# Patient Record
Sex: Male | Born: 1980 | Race: Black or African American | Hispanic: No | Marital: Single | Smoking: Current every day smoker
Health system: Southern US, Community
[De-identification: ages and names within clinical notes are randomized; demographics above are authoritative.]

## PROBLEM LIST (undated history)

## (undated) DIAGNOSIS — E119 Type 2 diabetes mellitus without complications: Secondary | ICD-10-CM

## (undated) HISTORY — PX: PENILE CYST REMOVAL: SHX2199

---

## 2005-04-03 ENCOUNTER — Emergency Department: Payer: Self-pay | Admitting: Emergency Medicine

## 2005-04-24 ENCOUNTER — Ambulatory Visit: Payer: Self-pay | Admitting: Physician Assistant

## 2007-05-28 ENCOUNTER — Ambulatory Visit: Payer: Self-pay | Admitting: Internal Medicine

## 2011-05-11 ENCOUNTER — Emergency Department: Payer: Self-pay | Admitting: Unknown Physician Specialty

## 2012-07-18 ENCOUNTER — Emergency Department: Payer: Self-pay | Admitting: Emergency Medicine

## 2012-07-19 LAB — COMPREHENSIVE METABOLIC PANEL
Albumin: 3.2 g/dL — ABNORMAL LOW (ref 3.4–5.0)
Anion Gap: 10 (ref 7–16)
BUN: 11 mg/dL (ref 7–18)
Bilirubin,Total: 0.2 mg/dL (ref 0.2–1.0)
Calcium, Total: 8.3 mg/dL — ABNORMAL LOW (ref 8.5–10.1)
Chloride: 109 mmol/L — ABNORMAL HIGH (ref 98–107)
EGFR (Non-African Amer.): >60
Glucose: 108 mg/dL — ABNORMAL HIGH (ref 65–99)
Potassium: 4.4 mmol/L (ref 3.5–5.1)
SGPT (ALT): 121 U/L — ABNORMAL HIGH (ref 12–78)
Total Protein: 7.4 g/dL (ref 6.4–8.2)

## 2012-07-19 LAB — URINALYSIS, COMPLETE
Bacteria: NONE SEEN
Bacteria: NONE SEEN
Bilirubin,UR: NEGATIVE
Bilirubin,UR: NEGATIVE
Glucose,UR: NEGATIVE mg/dL (ref 0–75)
Leukocyte Esterase: NEGATIVE
Ph: 6 (ref 4.5–8.0)
Protein: 100
Protein: 100
RBC,UR: <1 /HPF (ref 0–5)
Specific Gravity: 1.025 (ref 1.003–1.030)
Specific Gravity: 1.029 (ref 1.003–1.030)
Squamous Epithelial: NONE SEEN
WBC UR: 1 /HPF (ref 0–5)

## 2012-07-19 LAB — CBC
HCT: 48 % (ref 40.0–52.0)
HGB: 16.1 g/dL (ref 13.0–18.0)
MCH: 30.7 pg (ref 26.0–34.0)
MCHC: 33.6 g/dL (ref 32.0–36.0)
MCV: 91 fL (ref 80–100)
RDW: 14.2 % (ref 11.5–14.5)
WBC: 15.4 10*3/uL — ABNORMAL HIGH (ref 3.8–10.6)

## 2012-07-20 ENCOUNTER — Inpatient Hospital Stay: Payer: Self-pay | Admitting: Internal Medicine

## 2012-07-20 LAB — BASIC METABOLIC PANEL
Anion Gap: 8 (ref 7–16)
BUN: 9 mg/dL (ref 7–18)
Creatinine: 0.91 mg/dL (ref 0.60–1.30)
EGFR (Non-African Amer.): >60
Osmolality: 279 (ref 275–301)
Potassium: 3.9 mmol/L (ref 3.5–5.1)
Sodium: 140 mmol/L (ref 136–145)

## 2012-07-20 LAB — URINALYSIS, COMPLETE
Bacteria: NONE SEEN
Glucose,UR: NEGATIVE mg/dL (ref 0–75)
Ketone: NEGATIVE
Nitrite: NEGATIVE
Ph: 7 (ref 4.5–8.0)
Protein: NEGATIVE
RBC,UR: NONE SEEN /HPF (ref 0–5)
Specific Gravity: 1.005 (ref 1.003–1.030)
WBC UR: <1 /HPF (ref 0–5)

## 2012-07-20 LAB — CK
CK, Total: 48190 U/L — ABNORMAL HIGH (ref 35–232)
CK, Total: 44659 U/L — ABNORMAL HIGH (ref 35–232)

## 2012-07-21 LAB — COMPREHENSIVE METABOLIC PANEL
Alkaline Phosphatase: 65 U/L (ref 50–136)
BUN: 8 mg/dL (ref 7–18)
Bilirubin,Total: 0.3 mg/dL (ref 0.2–1.0)
Chloride: 109 mmol/L — ABNORMAL HIGH (ref 98–107)
Osmolality: 280 (ref 275–301)
Potassium: 4.2 mmol/L (ref 3.5–5.1)
Total Protein: 6.4 g/dL (ref 6.4–8.2)

## 2012-07-21 LAB — CK: CK, Total: 46768 U/L — ABNORMAL HIGH (ref 35–232)

## 2013-06-03 LAB — CBC
HGB: 14.5 g/dL (ref 13.0–18.0)
MCH: 30.1 pg (ref 26.0–34.0)
Platelet: 304 10*3/uL (ref 150–440)
RBC: 4.8 10*6/uL (ref 4.40–5.90)
RDW: 14 % (ref 11.5–14.5)

## 2013-06-04 ENCOUNTER — Inpatient Hospital Stay: Payer: Self-pay | Admitting: Internal Medicine

## 2013-06-04 LAB — COMPREHENSIVE METABOLIC PANEL
Alkaline Phosphatase: 89 U/L (ref 50–136)
Anion Gap: 3 — ABNORMAL LOW (ref 7–16)
BUN: 11 mg/dL (ref 7–18)
Co2: 28 mmol/L (ref 21–32)
Creatinine: 1.09 mg/dL (ref 0.60–1.30)
Glucose: 137 mg/dL — ABNORMAL HIGH (ref 65–99)
Osmolality: 279 (ref 275–301)
Potassium: 3.6 mmol/L (ref 3.5–5.1)
SGOT(AST): 17 U/L (ref 15–37)
SGPT (ALT): 22 U/L (ref 12–78)
Sodium: 139 mmol/L (ref 136–145)
Total Protein: 7.1 g/dL (ref 6.4–8.2)

## 2013-06-04 LAB — WBC: WBC: 20.5 10*3/uL — ABNORMAL HIGH (ref 3.8–10.6)

## 2013-06-09 LAB — CULTURE, BLOOD (SINGLE)

## 2014-11-02 ENCOUNTER — Emergency Department: Admit: 2014-11-02 | Discharge status: Against Medical Advice | Payer: Self-pay | Admitting: Emergency Medicine

## 2014-11-04 NOTE — Discharge Summary (Signed)
PATIENT NAME:  Randy Harris, Randy Harris MR#:  [id number] DATE OF BIRTH:  [birth year]  DATE OF ADMISSION:  07/20/2012 DATE OF DISCHARGE:  07/21/2012  ADMITTING PHYSICIAN: Marlaine HindAmir Darwish, MD    DISCHARGING PHYSICIAN: Enid Baasadhika Jilliam Bellmore, MD  PRIMARY CARE PHYSICIAN: None   NOTE: The patient left AGAINST MEDICAL ADVICE.    CONSULTATIONS IN THE HOSPITAL: None.  DISCHARGE DIAGNOSES: 1. Rhabdomyolysis with CPK at 46,000 when the patient left AGAINST MEDICAL ADVICE.   2. Myoglobinuria.  3. Elevated liver function tests secondary to muscle injury.   LABORATORY, DIAGNOSTIC AND RADIOLOGICAL DATA PRIOR TO DISCHARGE:  Total CK is 46,768.  Sodium 141, potassium 4.2, chloride 109, bicarbonate 25, BUN 8, creatinine 0.7, glucose 103, calcium of 8.2.  ALT 148, AST, 408, alkaline phosphatase 65, total bilirubin 0.3, albumin of 3.0. Urinalysis with 3+ blood and no RBCs seen.  Hepatitis panel is negative. WBC 15.4 on admission, hemoglobin 16.1, hematocrit 48.0, platelet count of 407.  Ultrasound testicle done for pain showed normal testicular ultrasound.  CT of the abdomen and pelvis was negative for any acute abnormalities.  BRIEF HOSPITAL COURSE: The patient is an obese 34 year old African American male who started recently vigorous exercise and comes with muscles aches and also bloody urine.   Rhabdomyolysis: The patient was found to be in acute rhabdomyolysis with CPK levels greater than 40,000 on admission. Probably the recent vigorous exercise could have triggered it. He was started on IV fluids and was encouraged to drink lot of water by mouth as well. His urine was clearing up, though his UA still showed 3+ blood with no RBCs in it. His CPK is about the same after one day of IV hydration, though clinically he feels a lot better. The patient was advised to stay for 1 more day to see if CPK improves. His renal function has not been affected so far. His LFTs are still elevated. However, due to some personal issues at  home the patient had to leave, and so he left AGAINST MEDICAL ADVICE.  All the risks were explained to the patient. Also, he was given a lab slip for follow-up CPK and basic metabolic panel in the next 4 days. He said he would go to an urgent care or ER to get that done. He was also advised to come back to the ER if any symptoms return. His course has been otherwise uneventful in the hospital.   ADDITIONAL TIME SPENT: 35 minutes.   ____________________________ Enid Baasadhika Lauran Romanski, MD rk:cb D: 07/21/2012 14:08:06 ET T: 07/21/2012 19:50:48 ET JOB#: [id number]  cc: Enid Baasadhika Takashi Korol, MD, <Dictator> Enid BaasADHIKA Ayshia Gramlich MD ELECTRONICALLY SIGNED 07/24/2012 14:30

## 2014-11-04 NOTE — H&P (Signed)
PATIENT NAME:  Randy Harris, Randy Harris MR#:  [id number] DATE OF BIRTH:  Mar 08, 1981  DATE OF ADMISSION:  06/04/2013  REFERRING PHYSICIAN: Dr. Lenard Lance.   CHIEF COMPLAINT: Left leg swelling.   HISTORY OF PRESENT ILLNESS: The patient is a morbidly obese African American male with no significant past medical history. He states that 2 days ago he started to have swelling, pain and redness in the left lower extremity. He has had no trauma. There is no drainage. He has had some chills but no reported fevers. When asked if he had an insect bite or anything of the like, he is not sure. He states that possibly he might have had a bite up in his thigh, but the swelling is all lower, mostly below the knee and some medial in the thigh. The questionable insect bite was more groin area. He cam into the hospital. He is tachycardic. He has a white count of 25. He had an ultrasound which did not show any DVT. Hospitalist service was contacted for further evaluation and management.   PAST MEDICAL HISTORY: History of rhabdo in the past. Otherwise, no past medical history.   FAMILY HISTORY: Denies a history of CHF, diabetes, hypertension. Both parents are healthy.   SURGICAL HISTORY: He had a leg fracture on the right.   SOCIAL HISTORY: Eight cigarettes a day. No alcohol or drug use.   OUTPATIENT MEDICATIONS: Denies.   ALLERGIES: Denies.   REVIEW OF SYSTEMS:   CONSTITUTIONAL: Positive for chills.  EYES: Denies blurry vision or double vision.  ENT: Denies tinnitus or hearing loss.  CARDIOVASCULAR: No palpitations, chest pain or syncope.  RESPIRATORY: No cough, wheezing, shortness of breath or COPD.   GASTROINTESTINAL: No nausea, vomiting, diarrhea, abdominal pain, hematemesis or melena.  GENITOURINARY: Denies dysuria or hematuria.  HEMATOLOGIC AND LYMPHATIC: No anemia or easy bruising.  SKIN: Rash as above.  MUSCULOSKELETAL: Denies arthritis or gout.  NEUROLOGIC: No focal weakness or numbness.  PSYCHIATRIC:  No anxiety or insomnia.   PHYSICAL EXAMINATION;  VITAL SIGNS: Temperature on arrival was 99.7, pulse rate 128, respiratory rate 18, blood pressure 140/67, O2 sat 97% on room air.  GENERAL: The patient is a morbidly obese male lying in bed, no obvious distress.  HEENT: Normocephalic, atraumatic. Pupils are equal and reactive. Anicteric sclerae. Extraocular muscles intact. Moist mucous membranes.  NECK: Supple. No thyroid tenderness. No cervical lymphadenopathy.  CARDIOVASCULAR: S1, S2, tachycardic. No significant murmurs appreciated.  LUNGS: Clear to auscultation. No wheezing, rhonchi or rales.  ABDOMEN: Soft, nontender and nondistended. Positive bowel sounds in all quadrants.  EXTREMITIES: The patient has 2+ edema on the left. No significant edema on the right. The patient has some erythema, redness, swelling mostly involving below the knee on the left and some tracking upwards medially. I have marked with a surgical marker around the cellulitic area. NEUROLOGIC: No focal deficits. Cranial nerves II through XII grossly intact. Strength is 5 out of 5 in all extremities. Sensation is intact to light touch.  PSYCHIATRIC: Awake, alert, oriented x 3.   LABORATORIES AND IMAGING: White count of 25, hemoglobin 14.5, platelets 304. Albumin 2.9, otherwise LFTs within normal limits. Glucose 137, BUN 11, creatinine 1.09. Ultrasound of lower extremity negative for lower extremity DVT. There are some cellulitic changes in the left lower leg and some presumably reactive left inguinal lymphadenopathy. EKG: Sinus tach. Rate is 123. No acute ST elevations or depressions.   ASSESSMENT AND PLAN: We have a 34 year old without significant past medical history who comes in with  sepsis secondary to cellulitis. He has tachycardia, leukocytosis and low-grade fever with evidence for cellulitis in the lower extremity on the left. There appears to be no deep vein thrombosis on the ultrasound. Will admit him to the hospital. Blood  cultures have been sent. Will follow with that. Would start him on intravenous Ancef. He has no allergy to penicillins. He has received a dose of clindamycin while in the Emergency Room. We will start him on Tylenol p.r.n. for pain or temperature as well as some morphine. Will start him on some normal saline. I believe the tachycardia is likely sepsis driven. Will put him on deep vein prophylaxis with Lovenox.   CODE STATUS: The patient is FULL CODE.   TOTAL TIME SPENT: 40 minutes.   ____________________________ Krystal EatonShayiq Cassey Bacigalupo, MD sa:gb D: 06/04/2013 01:47:57 ET T: 06/04/2013 02:57:46 ET JOB#: [id number]  cc: Krystal EatonShayiq Jeraldean Wechter, MD, <Dictator> Krystal EatonSHAYIQ Melvie Paglia MD ELECTRONICALLY SIGNED 06/21/2013 20:00

## 2014-11-04 NOTE — Discharge Summary (Signed)
PATIENT NAME:  Randy Harris, Airen MR#:  [id number] DATE OF BIRTH:  February 23, 1981  DATE OF ADMISSION:  06/04/2013 DATE OF DISCHARGE:  06/04/2013  ADMITTING DIAGNOSIS:  Sepsis.   DISCHARGE DIAGNOSES: 1.  Systemic inflammatory response syndrome. 2.  Left lower extremity cellulitis.  3.  Left lower extremity edema. No deep vein thrombosis.  4.  Leukocytosis due to cellulitis.  5.  Obesity.  6.  Diabetes mellitus with hemoglobin A1c of 6.7. 7.  Tobacco abuse.   DISCHARGE CONDITION: Stable.   DISCHARGE MEDICATIONS: The patient is to continue Tylenol 325 mg 2 tablets every 4 hours as needed, tramadol 50 mg 3 times daily as needed, nicotine inhaler every 1 to 2 hours as needed and Bactrim DS 800/160, 1 tablet twice daily for 10 more days.   HOME OXYGEN: None.   DIET: Low-salt, low-fat, low-cholesterol, carbohydrate-controlled diet, regular consistency.   ACTIVITY LIMITATIONS: As tolerated.     FOLLOWUP APPOINTMENTS: With [facility] in 2 days after discharge, as well as [facility] as outpatient in the next few days after discharge.    CONSULTANTS:  Case management, social work.  RADIOLOGIC STUDIES: Doppler ultrasound of the lower extremity on 06/11/2013, was negative for left lower extremity deep vein thrombosis. Cellulitic changes of left lower extremity were noted. This is presumably reactive left inguinal lymphadenopathy.   This is a 34 year old African American male with past medical history as noted in the past medical history, who presented to the hospital with complaints of left lower extremity swelling. Please refer to Dr. Lupe CarneyAhmadzia's admission note on 06/04/2013. The patient was complaining of chills.  His white blood cell count was noted to be elevated at 25,000. Hospitalist services were contacted for admission. The temperature on arrival was 99.7, pulse was 128, respiration rate  18, blood pressure 140/67 and O2 sats were 97% on room air. Physical exam revealed left lower  extremity edema, as well as some erythema in left lower extremity below the knee. The patient was admitted to the hospital for further evaluation. He was treated with IV antibiotics, and his white blood cell count was rechecked 06/04/2013. His white blood cell count improved significantly just with cefazolin infusion. It was felt that the patient very likely had streptococcus or at least methicillin-sensitive Staphylococcus aureus infection, so antibiotic therapy with Septra DS was initiated upon discharge. The patient is to continue antibiotics for 10 days and follow up with [facility] if any changes. He is also to continue pain medications with Tylenol, as well as tramadol. Because of his obesity, as well as hyperglycemia noted on his labs with  glucose level was 137 in nonfasting specimen, hemoglobin A1c was checked, which was found to be elevated at 6.7. The patient was consulted by dietitian, as well as [facility] inpatient service about his diabetes diagnosis. He is being discharged on diabetic diet. He is to follow up and establish primary care physician at the [facility] and follow along.  TSH was also checked and was found to be normal at 1.42. On the day of discharge, the patient felt satisfactory, did not complain of any significant discomfort. His vitals were stable with temperature of 98.7, pulse ranging from 108 to 112, respiratory rate was 20, blood pressure 113/68, saturation was 97% on room air at rest.   TIME SPENT: 40 minutes.   ____________________________ Katharina Caperima Zion Ta, MD rv:dmm D: 06/04/2013 19:33:00 ET T: 06/04/2013 21:17:38 ET JOB#: [id number]  cc: [facility] Katharina Caperima Chantel Teti, MD, <Dictator> Rebekha Diveley  MD ELECTRONICALLY SIGNED 06/22/2013 16:08

## 2014-11-04 NOTE — H&P (Signed)
PATIENT NAME:  MONTEZ, CUDA MR#:  [id number] DATE OF BIRTH:  19-Oct-1980  DATE OF ADMISSION:  07/20/2012  PRIMARY CARE PHYSICIAN:  He has no physician.   REFERRING PHYSICIAN:  Lucrezia Europe, M.D.   CHIEF COMPLAINT:  Leg pain. Additionally, patient reports blood in the urine.   HISTORY OF PRESENT ILLNESS: The patient is 34 year old African American male with history of obesity. He stated that he woke up this morning feeling sore in his legs, especially in the inner thigh. 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 Nevertheless, history taking from the patient appears that over the last one week and a half, he has been using aggressive exercise with cardio on elliptical machine trying to lose some weight. Evaluation here at the Emergency Department reveals evidence of severe rhabdomyolysis. His urinalysis showed the presence of blood. However, microscopic examination revealed no red blood cells. The patient was admitted for further evaluation and treatment of his rhabdomyolysis and urinary findings.  In addition, there is incidental finding of elevated liver transaminases.    REVIEW OF SYSTEMS:  CONSTITUTIONAL: Denies any fever. No chills. No fatigue, only mild.  EYES: No blurring of vision. No double vision.  ENT: No hearing impairment. No sore throat. No dysphagia.  CARDIOVASCULAR: No palpitations. No chest pain. No shortness of breath. No hemoptysis.  RESPIRATORY: No cough. No sputum production. No shortness of breath. No hemoptysis.  GASTROINTESTINAL: No nausea, no vomiting, no diarrhea. No abdominal pain. No melena. No hematochezia.  GENITOURINARY: No dysuria. No frequency of urination.  However, he reports red-colored urine.  MUSCULOSKELETAL: No joint swelling or pain. No muscular pain or swelling other than the pain in his inner thighs.  INTEGUMENTARY: No skin rash. No ulcers.   NEUROLOGY: No focal weakness. No seizure activity. No headache.  PSYCHIATRY: No anxiety. No depression.   ENDOCRINE: No polyuria or polydipsia. No heat or cold intolerance.  HEMATOLOGY: No easy bruisability. No lymph node enlargement.   PAST MEDICAL HISTORY: Healthy apart from the obesity and chronic smoking. Of note after reviewing his medical records, in 2005 he was admitted with a similar complaint, but his pain at that time was on the left side of the thigh. At that time CPK was not checked.   FAMILY HISTORY: Both parents are alive and healthy. No medical issues.   PAST SURGICAL HISTORY:  The patient had right leg fracture when he was child.   SOCIAL HABITS:  Chronic smoker, used to BE 10 cigarettes a day, Now it is 5 to 6 cigarettes a day. No alcohol or drug abuse.   SOCIAL HISTORY: He is unemployed, trying to find a job.   ADMISSION MEDICATIONS:  None.    PHYSICAL EXAMINATION: VITAL SIGNS: Blood pressure 164/75, respiratory rate 18, pulse 100, temperature 98.2, oxygen saturation 95%.  GENERAL APPEARANCE: Young male who was walking in his home in no acute distress.  HEAD AND NECK: No pallor. No icterus. No cyanosis.  ENT: Ear examination revealed normal hearing. No ulcers. No discharge. Nasal mucosa was normal without ulcers, no discharge, no lesions. Oropharyngeal area was normal without any oral thrush, no exudates, no ulcers.  EYES:  Eye examination revealed normal eyelids and conjunctiva. Pupils about 4 to 5 mm, round, equal and reactive to light.  NECK: Supple. Trachea at midline. No thyromegaly. No cervical lymphadenopathies.  HEART: Normal S1, S2. No S3 or S4.  No murmur. No gallop. Distant heart sounds. Difficult to auscultate. No carotid bruits.  LUNGS: Normal breathing pattern without use of accessory muscles. No rales. No wheezing.  ABDOMEN: Soft without tenderness. No hepatosplenomegaly. No masses. No hernias.  SKIN: No ulcers. No subcutaneous nodules.   MUSCULOSKELETAL: No joint swelling. No clubbing.  NEUROLOGIC: Cranial nerves II through XII are intact. No focal motor deficit.  PSYCHIATRIC:  The patient is alert and oriented x 3. Mood and affect were normal.   LABORATORY AND DIAGNOSTIC FINDINGS: The patient apparently had CAT scan of the abdomen and pelvis and that, too, was normal. Serum glucose 108, BUN 11, creatinine 0.8, sodium 141, potassium 4.4. Calcium was 8.3 with albumin of 3.2. Calculated calcium was normal. Total protein 7.4, AST elevated at 363, ALT 121. Normal bilirubin and alkaline phosphatase. CPK total 48,190.  CBC showed white count 15,000, hemoglobin 16, hematocrit 48, platelet count 407. Urinalysis showed +3 blood, 100 mg of protein, negative nitrite, no RBCs, no white blood cells.   ASSESSMENT: 1.  Rhabdomyolysis secondary to aggressive exercise. 2.  Myoglobinuria.   3.  Elevated liver enzymes, etiology is unclear, either secondary to nonspecific hepatitis or likely fatty liver infiltration;  however, differential diagnosis will include also viral hepatitis.  4.  Elevated white blood cell count.  This could be reactive to the rhabdomyolysis and exercise.  5. Elevated blood pressure with systolic ranging [id number], may indicate underlying hypertension or reactive to his stress. 6.  Obesity.  PLAN:  The patient was admitted for observation, aggressive IV hydration.  He is now receiving intravenous 1 liter of normal saline, then will continue on IV normal saline at 200 mL/hour. Repeat basic metabolic tomorrow to ensure there is no renal compromise. Repeat CPK in the morning to see the pattern whether it is declining or increasing. Monitor blood pressure to ensure there is no underlying hypertension.  For his elevated liver enzymes, I will repeat liver enzymes in the morning. Nevertheless, I will also check his viral hepatitis screen A, B and C. The patient needs to quit smoking. He was advised to abandon aggressive exercise and to take  a rest for the time being and later he can do reasonable exercise for weight reduction.   Time spent evaluating this patient: More than 40 minutes.      ____________________________ Carney CornersAmir M. Rudene Rearwish, MD amd:ct D: 07/20/2012 02:48:35 ET T: 07/20/2012 08:21:02 ET JOB#: [id number]  cc: Carney CornersAmir M. Rudene Rearwish, MD, <Dictator> Zollie ScaleAMIR M Rya Rausch MD ELECTRONICALLY SIGNED 07/21/2012 5:45

## 2014-12-06 ENCOUNTER — Emergency Department: Payer: 59

## 2014-12-06 ENCOUNTER — Emergency Department
Admission: EM | Admit: 2014-12-06 | Discharge: 2014-12-06 | Disposition: A | Discharge status: Home / Self Care | Payer: 59 | Attending: Emergency Medicine | Admitting: Emergency Medicine

## 2014-12-06 ENCOUNTER — Encounter: Payer: Self-pay | Admitting: Emergency Medicine

## 2014-12-06 DIAGNOSIS — Y92009 Unspecified place in unspecified non-institutional (private) residence as the place of occurrence of the external cause: Secondary | ICD-10-CM | POA: Insufficient documentation

## 2014-12-06 DIAGNOSIS — Z720 Tobacco use: Secondary | ICD-10-CM | POA: Diagnosis not present

## 2014-12-06 DIAGNOSIS — L0201 Cutaneous abscess of face: Secondary | ICD-10-CM | POA: Insufficient documentation

## 2014-12-06 DIAGNOSIS — L0211 Cutaneous abscess of neck: Secondary | ICD-10-CM | POA: Diagnosis not present

## 2014-12-06 DIAGNOSIS — W57XXXA Bitten or stung by nonvenomous insect and other nonvenomous arthropods, initial encounter: Secondary | ICD-10-CM | POA: Diagnosis not present

## 2014-12-06 DIAGNOSIS — Y998 Other external cause status: Secondary | ICD-10-CM | POA: Diagnosis not present

## 2014-12-06 DIAGNOSIS — S0086XA Insect bite (nonvenomous) of other part of head, initial encounter: Secondary | ICD-10-CM | POA: Diagnosis present

## 2014-12-06 DIAGNOSIS — Y9389 Activity, other specified: Secondary | ICD-10-CM | POA: Diagnosis not present

## 2014-12-06 DIAGNOSIS — L03221 Cellulitis of neck: Secondary | ICD-10-CM | POA: Diagnosis not present

## 2014-12-06 LAB — CBC WITH DIFFERENTIAL/PLATELET
BASOS PCT: 1 %
Basophils Absolute: 0.1 10*3/uL (ref 0–0.1)
EOS PCT: 4 %
Eosinophils Absolute: 0.4 10*3/uL (ref 0–0.7)
HCT: 45.9 % (ref 40.0–52.0)
HEMOGLOBIN: 14.9 g/dL (ref 13.0–18.0)
Lymphocytes Relative: 37 %
Lymphs Abs: 3.5 10*3/uL (ref 1.0–3.6)
MCH: 29.8 pg (ref 26.0–34.0)
MCHC: 32.5 g/dL (ref 32.0–36.0)
MCV: 91.7 fL (ref 80.0–100.0)
MONOS PCT: 9 %
Monocytes Absolute: 0.8 10*3/uL (ref 0.2–1.0)
NEUTROS ABS: 4.8 10*3/uL (ref 1.4–6.5)
Neutrophils Relative %: 49 %
PLATELETS: 301 10*3/uL (ref 150–440)
RBC: 5.01 MIL/uL (ref 4.40–5.90)
RDW: 14.6 % — ABNORMAL HIGH (ref 11.5–14.5)
WBC: 9.6 10*3/uL (ref 3.8–10.6)

## 2014-12-06 LAB — BASIC METABOLIC PANEL
Anion gap: 5 (ref 5–15)
BUN: 13 mg/dL (ref 6–20)
CALCIUM: 8.7 mg/dL — AB (ref 8.9–10.3)
CHLORIDE: 110 mmol/L (ref 101–111)
CO2: 26 mmol/L (ref 22–32)
Creatinine, Ser: 0.88 mg/dL (ref 0.61–1.24)
GFR calc non Af Amer: >60 mL/min (ref >60)
Glucose, Bld: 111 mg/dL — ABNORMAL HIGH (ref 65–99)
Potassium: 4.1 mmol/L (ref 3.5–5.1)
SODIUM: 141 mmol/L (ref 135–145)

## 2014-12-06 MED ORDER — MUPIROCIN 2 % EX OINT: TOPICAL_OINTMENT | CUTANEOUS | Status: AC

## 2014-12-06 MED ORDER — SODIUM CHLORIDE 0.9 % IV SOLN
INTRAVENOUS | Status: AC
  Administered 2014-12-06: 3 g via INTRAVENOUS
  Filled 2014-12-06: qty 3

## 2014-12-06 MED ORDER — SULFAMETHOXAZOLE-TRIMETHOPRIM 800-160 MG PO TABS: 1.0000 | ORAL_TABLET | Freq: Two times a day (BID) | ORAL | Status: DC

## 2014-12-06 MED ORDER — OXYCODONE-ACETAMINOPHEN 5-325 MG PO TABS: 1.0000 | ORAL_TABLET | Freq: Four times a day (QID) | ORAL | Status: DC | PRN

## 2014-12-06 MED ORDER — IOHEXOL 300 MG/ML  SOLN
75.0000 mL | Freq: Once | INTRAMUSCULAR | Status: AC | PRN
  Administered 2014-12-06: 75 mL via INTRAVENOUS

## 2014-12-06 MED ORDER — LIDOCAINE-EPINEPHRINE (PF) 1 %-1:200000 IJ SOLN
INTRAMUSCULAR | Status: AC
  Filled 2014-12-06: qty 30

## 2014-12-06 MED ORDER — SODIUM CHLORIDE 0.9 % IV SOLN
3.0000 g | Freq: Once | INTRAVENOUS | Status: AC
  Administered 2014-12-06: 3 g via INTRAVENOUS

## 2014-12-06 NOTE — ED Notes (Signed)
Pt informed to return if any life threatening symptoms occur.  

## 2014-12-06 NOTE — ED Notes (Signed)
MD at bedside. 

## 2014-12-06 NOTE — ED Provider Notes (Signed)
[facility] Emergency Department Provider Note     Time seen: ----------------------------------------- 12:42 PM on 12/06/2014 -----------------------------------------    I have reviewed the triage vital signs and the nursing notes.   HISTORY  Chief Complaint Insect Bite    HPI Randy Harris is a 34 y.o. male who presents ER for spider bite. Patient states he felt a spider: On his face 2 days ago and then he felt redness and swelling on his chin and subsequently. States he's had increased swelling to his chin and neck over the last day or so, states he can swallow but it hurts. Denies fevers chills or other complaints.     History reviewed. No pertinent past medical history.  There are no active problems to display for this patient.   History reviewed. No pertinent past surgical history.  No current outpatient prescriptions on file.  Allergies Review of patient's allergies indicates no known allergies.  History reviewed. No pertinent family history.  Social History History  Substance Use Topics  . Smoking status: Current Every Day Smoker  . Smokeless tobacco: Not on file  . Alcohol Use: No    Review of Systems Constitutional: Negative for fever. Eyes: Negative for visual changes. ENT: Negative for sore throat, positive for difficulty swallowing, chin swelling and pain. Cardiovascular: Negative for chest pain. Respiratory: Negative for shortness of breath. Gastrointestinal: Negative for abdominal pain, vomiting and diarrhea. Genitourinary: Negative for dysuria. Musculoskeletal: Negative for back pain. Skin: Negative for rash. Neurological: Negative for headaches, focal weakness or numbness.  10-point ROS otherwise negative.  ____________________________________________   PHYSICAL EXAM:  VITAL SIGNS: ED Triage Vitals  Enc Vitals Group     BP 12/06/14 1114 131/69 mmHg     Pulse Rate 12/06/14 1114 95     Resp 12/06/14  1114 20     Temp 12/06/14 1114 98 F (36.7 C)     Temp Source 12/06/14 1114 Oral     SpO2 12/06/14 1114 98 %     Weight 12/06/14 1114 336 lb (152.409 kg)     Height 12/06/14 1114  (1.753 m)     Head Cir --      Peak Flow --      Pain Score 12/06/14 1115 0     Pain Loc --      Pain Edu? --      Excl. in GC? --     Constitutional: Alert and oriented. Well appearing and in no distress. Eyes: Conjunctivae are normal. PERRL. Normal extraocular movements. ENT   Head: Patient has an abscess just to the right of midline on his chin anteriorly.   Nose: No congestion/rhinnorhea.   Mouth/Throat: There is some swelling on the floor of his mouth.   Neck: No stridor. Hematological/Lymphatic/Immunilogical: No cervical lymphadenopathy. Cardiovascular: Normal rate, regular rhythm. Normal and symmetric distal pulses are present in all extremities. No murmurs, rubs, or gallops. Respiratory: Normal respiratory effort without tachypnea nor retractions. Breath sounds are clear and equal bilaterally. No wheezes/rales/rhonchi. Gastrointestinal: Soft and nontender. No distention. No abdominal bruits. There is no CVA tenderness. Musculoskeletal: Nontender with normal range of motion in all extremities. No joint effusions.  No lower extremity tenderness nor edema. Neurologic:  Normal speech and language. No gross focal neurologic deficits are appreciated. Speech is normal. No gait instability. Skin:  Skin is warm, dry and intact. No rash noted. Psychiatric: Mood and affect are normal. Speech and behavior are normal. Patient exhibits appropriate insight and judgment.  ____________________________________________  LABS (pertinent positives/negatives)  Labs Reviewed  CBC WITH DIFFERENTIAL/PLATELET - Abnormal; Notable for the following:    RDW 14.6 (*)    All other components within normal limits  BASIC METABOLIC PANEL - Abnormal; Notable for the following:    Glucose, Bld 111 (*)     Calcium 8.7 (*)    All other components within normal limits  WOUND CULTURE    ____________________________________________  ED COURSE:  Pertinent labs & imaging results that were available during my care of the patient were reviewed by me and considered in my medical decision making (see chart for details).  Patient will need I&D of his chin abscess as well as CT of his neck to see how far this infection goes. ____________________________________________  Incision and drainage: Chin was cleaned and prepped in sterile fashion, 1% lidocaine with epinephrine was used to inject into the most tender area. #11 scalpel blade used to make a less than 1 cm incision without any purulence expressed, when did drain out was sent for wound culture. Patient tolerated procedure well as  Location. There is no packing placed placed as there is no significant abscess  RADIOLOGY  CT neck with contrast  IMPRESSION: Soft tissue stranding along the chin and underneath the mandible, right slightly greater than left. This may reflect cellulitis. No focal fluid collection. ____________________________________________    FINAL ASSESSMENT AND PLAN  Neck/chin cellulitis Incision and drainage Plan: Patient has received IV Unasyn here, will continue antibiotics and close outpatient follow-up. Patient agrees with plan   Emily FilbertWilliams, Sheina Mcleish E, MD   Emily FilbertJonathan E Solmon Bohr, MD 12/06/14 [phone]

## 2014-12-06 NOTE — ED Notes (Signed)
Pt states he swiped a spider off of his chin 2 days ago, since has bump and swelling to chin, swelling moving to neck area.  States it is starting to get hard to swallow.  No resp distress at this time

## 2014-12-06 NOTE — Discharge Instructions (Signed)

## 2014-12-06 NOTE — ED Notes (Signed)
Patient transported to CT 

## 2014-12-06 NOTE — ED Notes (Signed)
Pt. Arrived to ed from home with reports of a spider bite to chin. Pt report two days ago he was eating outside and someone told him he had a spider on him, pt reports knocking spider off of him but started experiencing swelling over the last day to check and neck. PT denies any respiratory distress at this time but states "it does seem like its hard to swallow at time".  Alert and oriented x three.  Denies fever. Pt reports warmth and tenderness to site.

## 2014-12-12 LAB — WOUND CULTURE

## 2015-08-22 ENCOUNTER — Encounter: Payer: Self-pay | Admitting: Emergency Medicine

## 2015-08-22 ENCOUNTER — Emergency Department: Payer: 59

## 2015-08-22 ENCOUNTER — Emergency Department
Admission: EM | Admit: 2015-08-22 | Discharge: 2015-08-22 | Disposition: A | Discharge status: Home / Self Care | Payer: 59 | Attending: Emergency Medicine | Admitting: Emergency Medicine

## 2015-08-22 DIAGNOSIS — R1032 Left lower quadrant pain: Secondary | ICD-10-CM | POA: Diagnosis present

## 2015-08-22 DIAGNOSIS — E119 Type 2 diabetes mellitus without complications: Secondary | ICD-10-CM | POA: Insufficient documentation

## 2015-08-22 DIAGNOSIS — F17210 Nicotine dependence, cigarettes, uncomplicated: Secondary | ICD-10-CM | POA: Diagnosis not present

## 2015-08-22 DIAGNOSIS — K5732 Diverticulitis of large intestine without perforation or abscess without bleeding: Secondary | ICD-10-CM | POA: Insufficient documentation

## 2015-08-22 HISTORY — DX: Type 2 diabetes mellitus without complications: E11.9

## 2015-08-22 LAB — CBC
HEMATOCRIT: 47.3 % (ref 40.0–52.0)
HEMOGLOBIN: 15.7 g/dL (ref 13.0–18.0)
MCH: 29.7 pg (ref 26.0–34.0)
MCHC: 33.3 g/dL (ref 32.0–36.0)
MCV: 89.2 fL (ref 80.0–100.0)
Platelets: 345 10*3/uL (ref 150–440)
RBC: 5.3 MIL/uL (ref 4.40–5.90)
RDW: 13.9 % (ref 11.5–14.5)
WBC: 12.1 10*3/uL — ABNORMAL HIGH (ref 3.8–10.6)

## 2015-08-22 LAB — COMPREHENSIVE METABOLIC PANEL
ALK PHOS: 65 U/L (ref 38–126)
ALT: 18 U/L (ref 17–63)
ANION GAP: 5 (ref 5–15)
AST: 14 U/L — ABNORMAL LOW (ref 15–41)
Albumin: 3.9 g/dL (ref 3.5–5.0)
BUN: 11 mg/dL (ref 6–20)
CALCIUM: 8.6 mg/dL — AB (ref 8.9–10.3)
CO2: 26 mmol/L (ref 22–32)
Chloride: 108 mmol/L (ref 101–111)
Creatinine, Ser: 0.98 mg/dL (ref 0.61–1.24)
GFR calc non Af Amer: >60 mL/min (ref >60)
Glucose, Bld: 96 mg/dL (ref 65–99)
POTASSIUM: 4 mmol/L (ref 3.5–5.1)
SODIUM: 139 mmol/L (ref 135–145)
Total Bilirubin: 0.4 mg/dL (ref 0.3–1.2)
Total Protein: 7.2 g/dL (ref 6.5–8.1)

## 2015-08-22 LAB — URINALYSIS COMPLETE WITH MICROSCOPIC (ARMC ONLY)
Bacteria, UA: NONE SEEN
Bilirubin Urine: NEGATIVE
Glucose, UA: NEGATIVE mg/dL
Hgb urine dipstick: NEGATIVE
KETONES UR: NEGATIVE mg/dL
Leukocytes, UA: NEGATIVE
NITRITE: NEGATIVE
PROTEIN: NEGATIVE mg/dL
SPECIFIC GRAVITY, URINE: 1.025 (ref 1.005–1.030)
pH: 5 (ref 5.0–8.0)

## 2015-08-22 LAB — LIPASE, BLOOD: LIPASE: 32 U/L (ref 11–51)

## 2015-08-22 MED ORDER — CIPROFLOXACIN HCL 500 MG PO TABS: 500.0000 mg | ORAL_TABLET | Freq: Two times a day (BID) | ORAL | Status: AC

## 2015-08-22 MED ORDER — OXYCODONE-ACETAMINOPHEN 5-325 MG PO TABS: 2.0000 | ORAL_TABLET | Freq: Four times a day (QID) | ORAL | Status: DC | PRN

## 2015-08-22 MED ORDER — OXYCODONE-ACETAMINOPHEN 5-325 MG PO TABS
2.0000 | ORAL_TABLET | Freq: Once | ORAL | Status: DC
  Filled 2015-08-22: qty 2

## 2015-08-22 MED ORDER — METRONIDAZOLE 500 MG PO TABS: 500.0000 mg | ORAL_TABLET | Freq: Three times a day (TID) | ORAL | Status: DC

## 2015-08-22 MED ORDER — DOCUSATE SODIUM 100 MG PO CAPS: 100.0000 mg | ORAL_CAPSULE | Freq: Every day | ORAL | Status: AC | PRN

## 2015-08-22 MED ORDER — METRONIDAZOLE IN NACL 5-0.79 MG/ML-% IV SOLN
500.0000 mg | Freq: Once | INTRAVENOUS | Status: AC
  Administered 2015-08-22: 500 mg via INTRAVENOUS
  Filled 2015-08-22: qty 100

## 2015-08-22 MED ORDER — HYDROMORPHONE HCL 1 MG/ML IJ SOLN
1.0000 mg | Freq: Once | INTRAMUSCULAR | Status: AC
  Administered 2015-08-22: 1 mg via INTRAVENOUS
  Filled 2015-08-22: qty 1

## 2015-08-22 MED ORDER — CIPROFLOXACIN IN D5W 400 MG/200ML IV SOLN
400.0000 mg | Freq: Once | INTRAVENOUS | Status: AC
  Administered 2015-08-22: 400 mg via INTRAVENOUS
  Filled 2015-08-22: qty 200

## 2015-08-22 NOTE — Discharge Instructions (Signed)
Diverticulitis °Diverticulitis is inflammation or infection of small pouches in your colon that form when you have a condition called diverticulosis. The pouches in your colon are called diverticula. Your colon, or large intestine, is where water is absorbed and stool is formed. °Complications of diverticulitis can include: °· Bleeding. °· Severe infection. °· Severe pain. °· Perforation of your colon. °· Obstruction of your colon. °CAUSES  °Diverticulitis is caused by bacteria. °Diverticulitis happens when stool becomes trapped in diverticula. This allows bacteria to grow in the diverticula, which can lead to inflammation and infection. °RISK FACTORS °People with diverticulosis are at risk for diverticulitis. Eating a diet that does not include enough fiber from fruits and vegetables may make diverticulitis more likely to develop. °SYMPTOMS  °Symptoms of diverticulitis may include: °· Abdominal pain and tenderness. The pain is normally located on the left side of the abdomen, but may occur in other areas. °· Fever and chills. °· Bloating. °· Cramping. °· Nausea. °· Vomiting. °· Constipation. °· Diarrhea. °· Blood in your stool. °DIAGNOSIS  °Your health care provider will ask you about your medical history and do a physical exam. You may need to have tests done because many medical conditions can cause the same symptoms as diverticulitis. Tests may include: °· Blood tests. °· Urine tests. °· Imaging tests of the abdomen, including X-rays and CT scans. °When your condition is under control, your health care provider may recommend that you have a colonoscopy. A colonoscopy can show how severe your diverticula are and whether something else is causing your symptoms. °TREATMENT  °Most cases of diverticulitis are mild and can be treated at home. Treatment may include: °· Taking over-the-counter pain medicines. °· Following a clear liquid diet. °· Taking antibiotic medicines by mouth for 7-10 days. °More severe cases may  be treated at a hospital. Treatment may include: °· Not eating or drinking. °· Taking prescription pain medicine. °· Receiving antibiotic medicines through an IV tube. °· Receiving fluids and nutrition through an IV tube. °· Surgery. °HOME CARE INSTRUCTIONS  °· Follow your health care provider's instructions carefully. °· Follow a full liquid diet or other diet as directed by your health care provider. After your symptoms improve, your health care provider may tell you to change your diet. He or she may recommend you eat a high-fiber diet. Fruits and vegetables are good sources of fiber. Fiber makes it easier to pass stool. °· Take fiber supplements or probiotics as directed by your health care provider. °· Only take medicines as directed by your health care provider. °· Keep all your follow-up appointments. °SEEK MEDICAL CARE IF:  °· Your pain does not improve. °· You have a hard time eating food. °· Your bowel movements do not return to normal. °SEEK IMMEDIATE MEDICAL CARE IF:  °· Your pain becomes worse. °· Your symptoms do not get better. °· Your symptoms suddenly get worse. °· You have a fever. °· You have repeated vomiting. °· You have bloody or black, tarry stools. °MAKE SURE YOU:  °· Understand these instructions. °· Will watch your condition. °· Will get help right away if you are not doing well or get worse. °  °This information is not intended to replace advice given to you by your health care provider. Make sure you discuss any questions you have with your health care provider. °  °Document Released: 04/10/2005 Document Revised: 07/06/2013 Document Reviewed: 05/26/2013 °Elsevier Interactive Patient Education ©2016 [employer] ° °

## 2015-08-22 NOTE — ED Provider Notes (Signed)
[facility] Emergency Department Provider Note     Time seen: ----------------------------------------- 8:41 AM on 08/22/2015 -----------------------------------------    I have reviewed the triage vital signs and the nursing notes.   HISTORY  Chief Complaint Abdominal Pain    HPI Deborah Dondero is a 35 y.o. male who presents ER with left lower quadrant pain for the last 3 days. Patient states that sharp, nothing makes it better or worse. Patient has not had fever, chills, chest pain, shortness of breath, nausea vomiting or diarrhea. Nothing makes his symptoms better or worse. He states she's never had before.   Past Medical History  Diagnosis Date  . Diabetes mellitus without complication (HCC)     There are no active problems to display for this patient.   History reviewed. No pertinent past surgical history.  Allergies Review of patient's allergies indicates no known allergies.  Social History Social History  Substance Use Topics  . Smoking status: Current Every Day Smoker -- 0.50 packs/day    Types: Cigarettes  . Smokeless tobacco: None  . Alcohol Use: No    Review of Systems Constitutional: Negative for fever. Eyes: Negative for visual changes. ENT: Negative for sore throat. Cardiovascular: Negative for chest pain. Respiratory: Negative for shortness of breath. Gastrointestinal: Positive for abdominal pain on left side Genitourinary: Negative for dysuria. Musculoskeletal: Negative for back pain. Skin: Negative for rash. Neurological: Negative for headaches, focal weakness or numbness.  10-point ROS otherwise negative.  ____________________________________________   PHYSICAL EXAM:  VITAL SIGNS: ED Triage Vitals  Enc Vitals Group     BP 08/22/15 0622 123/64 mmHg     Pulse Rate 08/22/15 0622 103     Resp 08/22/15 0622 17     Temp 08/22/15 0622 98.4 F (36.9 C)     Temp Source 08/22/15 0622 Oral     SpO2 08/22/15  0622 97 %     Weight 08/22/15 0622 346 lb (156.945 kg)     Height 08/22/15 0622  (1.753 m)     Head Cir --      Peak Flow --      Pain Score 08/22/15 0636 8     Pain Loc --      Pain Edu? --      Excl. in GC? --     Constitutional: Alert and oriented. Well appearing and in no distress. Eyes: Conjunctivae are normal. PERRL. Normal extraocular movements. ENT   Head: Normocephalic and atraumatic.   Nose: No congestion/rhinnorhea.   Mouth/Throat: Mucous membranes are moist.   Neck: No stridor. Cardiovascular: Normal rate, regular rhythm. Normal and symmetric distal pulses are present in all extremities. No murmurs, rubs, or gallops. Respiratory: Normal respiratory effort without tachypnea nor retractions. Breath sounds are clear and equal bilaterally. No wheezes/rales/rhonchi. Gastrointestinal: Left lower quadrant tenderness, no rebound or guarding. Normal bowel sounds. Musculoskeletal: Nontender with normal range of motion in all extremities. No joint effusions.  No lower extremity tenderness nor edema. Neurologic:  Normal speech and language. No gross focal neurologic deficits are appreciated. Speech is normal. No gait instability. Skin:  Skin is warm, dry and intact. No rash noted. Psychiatric: Mood and affect are normal. Speech and behavior are normal. Patient exhibits appropriate insight and judgment. ____________________________________________  ED COURSE:  Pertinent labs & imaging results that were available during my care of the patient were reviewed by me and considered in my medical decision making (see chart for details). Patient is in no acute distress, likely either constipation,  diverticulitis or renal colic. I will obtain CT imaging. ____________________________________________    LABS (pertinent positives/negatives)  Labs Reviewed  COMPREHENSIVE METABOLIC PANEL - Abnormal; Notable for the following:    Calcium 8.6 (*)    AST 14 (*)    All other  components within normal limits  CBC - Abnormal; Notable for the following:    WBC 12.1 (*)    All other components within normal limits  URINALYSIS COMPLETEWITH MICROSCOPIC ([facility] ONLY) - Abnormal; Notable for the following:    Color, Urine YELLOW (*)    APPearance CLEAR (*)    Squamous Epithelial / LPF 0-5 (*)    All other components within normal limits  LIPASE, BLOOD    RADIOLOGY Images were viewed by me  CT renal protocol Reveals inflammatory stranding and diverticulitis of the descending colon  IMPRESSION: 1. Seven cm inflamed segment of the colon in the left lower quadrant. Favor acute diverticulitis or infectious colitis. Recommend followup after the acute illness has resolved, such as with endoscopy, to exclude neoplasm which can have a similar appearance and presentation. 2. Otherwise negative noncontrast CT Abdomen and Pelvis. ____________________________________________  FINAL ASSESSMENT AND PLAN  Diverticulitis  Plan: Patient with labs and imaging as dictated above. Patient was started on Cipro and Flagyl IV here, likely diverticulitis or colitis seen on CT. He is started on Cipro and Flagyl, will be discharged home on same with pain medicine and Colace. He is stable for outpatient follow-up in 2 days with his doctor which ER and he has scheduled.   Emily Filbert, MD   Emily Filbert, MD 08/22/15 [phone]

## 2015-08-22 NOTE — ED Notes (Signed)
Patient ambulatory to triage with steady gait, without difficulty or distress noted; pt reports left lower abd pain x 3 days with no accomp symptoms; denies hx of same

## 2017-03-23 ENCOUNTER — Inpatient Hospital Stay
Admission: EM | Admit: 2017-03-23 | Discharge: 2017-03-25 | DRG: 871 | Disposition: A | Discharge status: Home / Self Care | Payer: 59 | Attending: Specialist | Admitting: Specialist

## 2017-03-23 ENCOUNTER — Emergency Department: Payer: 59

## 2017-03-23 DIAGNOSIS — Z6834 Body mass index (BMI) 34.0-34.9, adult: Secondary | ICD-10-CM

## 2017-03-23 DIAGNOSIS — A419 Sepsis, unspecified organism: Principal | ICD-10-CM | POA: Diagnosis present

## 2017-03-23 DIAGNOSIS — E119 Type 2 diabetes mellitus without complications: Secondary | ICD-10-CM | POA: Diagnosis present

## 2017-03-23 DIAGNOSIS — L03116 Cellulitis of left lower limb: Secondary | ICD-10-CM | POA: Diagnosis present

## 2017-03-23 DIAGNOSIS — E785 Hyperlipidemia, unspecified: Secondary | ICD-10-CM | POA: Diagnosis present

## 2017-03-23 DIAGNOSIS — N170 Acute kidney failure with tubular necrosis: Secondary | ICD-10-CM | POA: Diagnosis present

## 2017-03-23 DIAGNOSIS — Z79899 Other long term (current) drug therapy: Secondary | ICD-10-CM | POA: Diagnosis not present

## 2017-03-23 DIAGNOSIS — Z7984 Long term (current) use of oral hypoglycemic drugs: Secondary | ICD-10-CM | POA: Diagnosis not present

## 2017-03-23 DIAGNOSIS — F17210 Nicotine dependence, cigarettes, uncomplicated: Secondary | ICD-10-CM | POA: Diagnosis present

## 2017-03-23 DIAGNOSIS — I10 Essential (primary) hypertension: Secondary | ICD-10-CM | POA: Diagnosis present

## 2017-03-23 DIAGNOSIS — R6520 Severe sepsis without septic shock: Secondary | ICD-10-CM | POA: Diagnosis present

## 2017-03-23 LAB — CBC WITH DIFFERENTIAL/PLATELET
BASOS ABS: 0 10*3/uL (ref 0–0.1)
Basophils Relative: 0 %
Eosinophils Absolute: 0 10*3/uL (ref 0–0.7)
Eosinophils Relative: 0 %
HCT: 43.3 % (ref 40.0–52.0)
Hemoglobin: 14.6 g/dL (ref 13.0–18.0)
LYMPHS ABS: 1.4 10*3/uL (ref 1.0–3.6)
Lymphocytes Relative: 4 %
MCH: 29.5 pg (ref 26.0–34.0)
MCHC: 33.7 g/dL (ref 32.0–36.0)
MCV: 87.7 fL (ref 80.0–100.0)
Monocytes Absolute: 2.4 10*3/uL — ABNORMAL HIGH (ref 0.2–1.0)
Monocytes Relative: 7 %
NEUTROS ABS: 30.4 10*3/uL — AB (ref 1.4–6.5)
Neutrophils Relative %: 89 %
Platelets: 331 10*3/uL (ref 150–440)
RBC: 4.94 MIL/uL (ref 4.40–5.90)
RDW: 14.1 % (ref 11.5–14.5)
WBC: 34.2 10*3/uL — AB (ref 3.8–10.6)

## 2017-03-23 LAB — COMPREHENSIVE METABOLIC PANEL
ALK PHOS: 54 U/L (ref 38–126)
ALT: 26 U/L (ref 17–63)
ANION GAP: 9 (ref 5–15)
AST: 27 U/L (ref 15–41)
Albumin: 3.5 g/dL (ref 3.5–5.0)
BILIRUBIN TOTAL: 0.6 mg/dL (ref 0.3–1.2)
BUN: 10 mg/dL (ref 6–20)
CALCIUM: 8.7 mg/dL — AB (ref 8.9–10.3)
CO2: 23 mmol/L (ref 22–32)
Chloride: 104 mmol/L (ref 101–111)
Creatinine, Ser: 1.32 mg/dL — ABNORMAL HIGH (ref 0.61–1.24)
Glucose, Bld: 213 mg/dL — ABNORMAL HIGH (ref 65–99)
Potassium: 3.8 mmol/L (ref 3.5–5.1)
Sodium: 136 mmol/L (ref 135–145)
TOTAL PROTEIN: 6.7 g/dL (ref 6.5–8.1)

## 2017-03-23 LAB — GLUCOSE, CAPILLARY: Glucose-Capillary: 176 mg/dL — ABNORMAL HIGH (ref 65–99)

## 2017-03-23 LAB — PROTIME-INR
INR: 1.09
Prothrombin Time: 14 seconds (ref 11.4–15.2)

## 2017-03-23 LAB — LACTIC ACID, PLASMA
LACTIC ACID, VENOUS: 2.3 mmol/L — AB (ref 0.5–1.9)
Lactic Acid, Venous: 2.5 mmol/L (ref 0.5–1.9)

## 2017-03-23 MED ORDER — ACETAMINOPHEN 650 MG RE SUPP
650.0000 mg | Freq: Four times a day (QID) | RECTAL | Status: DC | PRN
Start: 2017-03-23 — End: 2017-03-25

## 2017-03-23 MED ORDER — ACETAMINOPHEN 325 MG PO TABS
650.0000 mg | ORAL_TABLET | Freq: Four times a day (QID) | ORAL | Status: DC | PRN
  Administered 2017-03-24 (×2): 650 mg via ORAL
  Filled 2017-03-23 (×2): qty 2

## 2017-03-23 MED ORDER — VANCOMYCIN HCL IN DEXTROSE 1-5 GM/200ML-% IV SOLN
1000.0000 mg | Freq: Once | INTRAVENOUS | Status: AC
  Administered 2017-03-23: 1000 mg via INTRAVENOUS
  Filled 2017-03-23: qty 200

## 2017-03-23 MED ORDER — HEPARIN SODIUM (PORCINE) 5000 UNIT/ML IJ SOLN
5000.0000 [IU] | Freq: Three times a day (TID) | INTRAMUSCULAR | Status: DC
  Administered 2017-03-23 – 2017-03-24 (×3): 5000 [IU] via SUBCUTANEOUS
  Filled 2017-03-23 (×3): qty 1

## 2017-03-23 MED ORDER — INSULIN ASPART 100 UNIT/ML ~~LOC~~ SOLN: 0.0000 [IU] | Freq: Every day | SUBCUTANEOUS | Status: DC

## 2017-03-23 MED ORDER — VANCOMYCIN HCL 10 G IV SOLR
1500.0000 mg | Freq: Three times a day (TID) | INTRAVENOUS | Status: DC
  Administered 2017-03-24 (×2): 1500 mg via INTRAVENOUS
  Filled 2017-03-23 (×4): qty 1500

## 2017-03-23 MED ORDER — ACETAMINOPHEN 325 MG PO TABS
650.0000 mg | ORAL_TABLET | Freq: Once | ORAL | Status: AC
  Administered 2017-03-23: 650 mg via ORAL
  Filled 2017-03-23: qty 2

## 2017-03-23 MED ORDER — ONDANSETRON HCL 4 MG PO TABS: 4.0000 mg | ORAL_TABLET | Freq: Four times a day (QID) | ORAL | Status: DC | PRN

## 2017-03-23 MED ORDER — METFORMIN HCL ER 500 MG PO TB24
1000.0000 mg | ORAL_TABLET | Freq: Every day | ORAL | Status: DC
  Administered 2017-03-24 – 2017-03-25 (×2): 1000 mg via ORAL
  Filled 2017-03-23 (×2): qty 2

## 2017-03-23 MED ORDER — PIPERACILLIN-TAZOBACTAM 3.375 G IVPB
3.3750 g | Freq: Three times a day (TID) | INTRAVENOUS | Status: DC
  Administered 2017-03-23 – 2017-03-25 (×5): 3.375 g via INTRAVENOUS
  Filled 2017-03-23 (×9): qty 50

## 2017-03-23 MED ORDER — GLIPIZIDE ER 10 MG PO TB24
20.0000 mg | ORAL_TABLET | Freq: Every day | ORAL | Status: DC
  Administered 2017-03-24 – 2017-03-25 (×2): 20 mg via ORAL
  Filled 2017-03-23 (×2): qty 2

## 2017-03-23 MED ORDER — SODIUM CHLORIDE 0.9 % IV SOLN
INTRAVENOUS | Status: AC
  Administered 2017-03-23: 22:00:00 via INTRAVENOUS

## 2017-03-23 MED ORDER — LISINOPRIL 5 MG PO TABS
5.0000 mg | ORAL_TABLET | Freq: Every day | ORAL | Status: DC
  Administered 2017-03-24: 5 mg via ORAL
  Filled 2017-03-23: qty 1

## 2017-03-23 MED ORDER — PRAVASTATIN SODIUM 20 MG PO TABS
40.0000 mg | ORAL_TABLET | Freq: Every day | ORAL | Status: DC
  Administered 2017-03-24 – 2017-03-25 (×2): 40 mg via ORAL
  Filled 2017-03-23 (×2): qty 2

## 2017-03-23 MED ORDER — ONDANSETRON HCL 4 MG/2ML IJ SOLN: 4.0000 mg | Freq: Four times a day (QID) | INTRAMUSCULAR | Status: DC | PRN

## 2017-03-23 MED ORDER — SODIUM CHLORIDE 0.9 % IV BOLUS (SEPSIS)
1000.0000 mL | Freq: Once | INTRAVENOUS | Status: AC
  Administered 2017-03-23: 1000 mL via INTRAVENOUS

## 2017-03-23 MED ORDER — INSULIN ASPART 100 UNIT/ML ~~LOC~~ SOLN
0.0000 [IU] | Freq: Three times a day (TID) | SUBCUTANEOUS | Status: DC
  Administered 2017-03-24: 3 [IU] via SUBCUTANEOUS
  Administered 2017-03-24: 2 [IU] via SUBCUTANEOUS
  Administered 2017-03-24: 3 [IU] via SUBCUTANEOUS
  Administered 2017-03-25: 2 [IU] via SUBCUTANEOUS
  Filled 2017-03-23 (×4): qty 1

## 2017-03-23 MED ORDER — IBUPROFEN 800 MG PO TABS
800.0000 mg | ORAL_TABLET | Freq: Once | ORAL | Status: AC
  Administered 2017-03-23: 800 mg via ORAL
  Filled 2017-03-23: qty 1

## 2017-03-23 MED ORDER — PIPERACILLIN-TAZOBACTAM 3.375 G IVPB 30 MIN
3.3750 g | Freq: Once | INTRAVENOUS | Status: AC
  Administered 2017-03-23: 3.375 g via INTRAVENOUS
  Filled 2017-03-23: qty 50

## 2017-03-23 NOTE — ED Notes (Signed)
Pt unable to void at this time. Will bring labeled specimen cup with him to his room.

## 2017-03-23 NOTE — ED Provider Notes (Signed)
[name] Emergency Department Provider Note ____________________________________________   I have reviewed the triage vital signs and the triage nursing note.  HISTORY  Chief Complaint Fever and Abscess   Historian Patient  HPI Randy Harris is a 36 y.o. male With history of diabetes as well as history of prior episodes of lower extremity cellulitis, presents stating that he woke up this morning with pain to the left groin/quadriceps area as well as fever. He states this is how he has previously been also diagnosed with cellulitis.  No pain or swelling to the scrotum or penis itself. No redness. Pain is mild, not significant is not asking for pain medications.    Past Medical History:  Diagnosis Date  . Diabetes mellitus without complication (HCC)     There are no active problems to display for this patient.   History reviewed. No pertinent surgical history.  Prior to Admission medications   Medication Sig Start Date End Date Taking? Authorizing Provider  glipiZIDE (GLUCOTROL XL) 10 MG 24 hr tablet Take by mouth. 08/05/16 08/05/17 Yes [provider]  lisinopril (PRINIVIL,ZESTRIL) 5 MG tablet Take by mouth. 08/05/16 08/05/17 Yes [provider]  metFORMIN (GLUCOPHAGE-XR) 500 MG 24 hr tablet Take by mouth. 08/05/16 08/05/17 Yes [provider]  pravastatin (PRAVACHOL) 40 MG tablet Take by mouth. 08/05/16 08/05/17 Yes [provider]  metroNIDAZOLE (FLAGYL) 500 MG tablet Take 1 tablet (500 mg total) by mouth 3 (three) times daily. Patient not taking: Reported on 03/23/2017 08/22/15   Emily FilbertWilliams, Jonathan E, MD  oxyCODONE-acetaminophen (PERCOCET) 5-325 MG tablet Take 2 tablets by mouth every 6 (six) hours as needed for moderate pain or severe pain. Patient not taking: Reported on 03/23/2017 08/22/15   Emily FilbertWilliams, Jonathan E, MD  oxyCODONE-acetaminophen (ROXICET) 5-325 MG per tablet Take 1 tablet by mouth every 6 (six) hours as  needed. Patient not taking: Reported on 03/23/2017 12/06/14   Emily FilbertWilliams, Jonathan E, MD  sulfamethoxazole-trimethoprim (BACTRIM DS) 800-160 MG per tablet Take 1 tablet by mouth 2 (two) times daily. Patient not taking: Reported on 03/23/2017 12/06/14   Emily FilbertWilliams, Jonathan E, MD    No Known Allergies  No family history on file.  Social History Social History  Substance Use Topics  . Smoking status: Current Every Day Smoker    Packs/day: 0.50    Types: Cigarettes  . Smokeless tobacco: Never Used  . Alcohol use No    Review of Systems  Constitutional: positivefor fever. Eyes: Negative for visual changes. ENT: Negative for sore throat. Cardiovascular: Negative for chest pain. Respiratory: Negative for shortness of breath. Gastrointestinal: Negative for abdominal pain, vomiting and diarrhea. Genitourinary: Negative for dysuria. Musculoskeletal: Negative for back pain.  moderate left quadricep/groin pain. Skin: Negative for rash. Neurological: Negative for headache.  ____________________________________________   PHYSICAL EXAM:  VITAL SIGNS: ED Triage Vitals  Enc Vitals Group     BP 03/23/17 1413 (!) 155/71     Pulse Rate 03/23/17 1413 (!) 142     Resp 03/23/17 1413 18     Temp 03/23/17 1413 (!) 103.1 F (39.5 C)     Temp Source 03/23/17 1413 Oral     SpO2 03/23/17 1413 98 %     Weight 03/23/17 1414 (!) 370 lb (167.8 kg)     Height 03/23/17 1414 5\' 9"  (1.753 m)     Head Circumference --      Peak Flow --      Pain Score 03/23/17 1413 9     Pain  Loc --      Pain Edu? --      Excl. in GC? --      Constitutional: Alert and oriented. Well appearing and in no distress. HEENT   Head: Normocephalic and atraumatic.      Eyes: Conjunctivae are normal. Pupils equal and round.       Ears:         Nose: No congestion/rhinnorhea.   Mouth/Throat: Mucous membranes are moist.   Neck: No stridor. Cardiovascular/Chest: tachycardicrate, regular rhythm.  No murmurs, rubs, or  gallops. Respiratory: Normal respiratory effort without tachypnea nor retractions. Breath sounds are clear and equal bilaterally. No wheezes/rales/rhonchi. Gastrointestinal: Soft. No distention, no guarding, no rebound. Nontender.    Genitourinary/rectal:  no swelling or tenderness to the scrotum or penis. Musculoskeletal: some mild tenderness to the upper inner quadricep without any obvious evidence of cellulitis or skin rash or fluctuance or induration.Nontender with normal range of motion in all extremities. No joint effusions.  No lower extremity tenderness.  No edema. Neurologic:  Normal speech and language. No gross or focal neurologic deficits are appreciated. Skin:  Skin is warm, dry and intact. No rash noted. Psychiatric: Mood and affect are normal. Speech and behavior are normal. Patient exhibits appropriate insight and judgment.   ____________________________________________  LABS (pertinent positives/negatives)  Labs Reviewed  COMPREHENSIVE METABOLIC PANEL - Abnormal; Notable for the following:       Result Value   Glucose, Bld 213 (*)    Creatinine, Ser 1.32 (*)    Calcium 8.7 (*)    All other components within normal limits  LACTIC ACID, PLASMA - Abnormal; Notable for the following:    Lactic Acid, Venous 2.5 (*)    All other components within normal limits  CBC WITH DIFFERENTIAL/PLATELET - Abnormal; Notable for the following:    WBC 34.2 (*)    Neutro Abs 30.4 (*)    Monocytes Absolute 2.4 (*)    All other components within normal limits  CULTURE, BLOOD (ROUTINE X 2)  CULTURE, BLOOD (ROUTINE X 2)  PROTIME-INR  LACTIC ACID, PLASMA  URINALYSIS, COMPLETE (UACMP) WITH MICROSCOPIC    ____________________________________________    EKG I, Governor Rooks, MD, the attending physician have personally viewed and interpreted all ECGs.  none ____________________________________________  RADIOLOGY All Xrays were viewed by me. Imaging interpreted by Radiologist.  chest  x-ray two-view:  IMPRESSION: No active cardiopulmonary disease. __________________________________________  PROCEDURES  Procedure(s) performed: None  Critical Care performed: None  ____________________________________________   ED COURSE / ASSESSMENT AND PLAN  Pertinent labs & imaging results that were available during my care of the patient were reviewed by me and considered in my medical decision making (see chart for details).    Mr. Vise is here with a fever, uncertain source although the patient states that he's had 2 episodes where he developed a cellulitis and the fever and body aches started before the skin rash was diagnosed. He is complaining of some discomfort at the left medial quadriceps. No evidence for Fournier's gangrene. He does meet sepsis criteria with elevated heart rate and fever and elevated white blood cell count. Patient was placed on antibiotics for most likely source being cellulitis, but would cover broad-spectrum with vancomycin and Zosyn.   No respiratory symptoms and chest x-ray is clear. I have sent a urinalysis as well as blood cultures and urine culture.    CONSULTATIONS: hospitalist for admission.   Patient / Family / Caregiver informed of clinical course, medical decision-making process, and  agree with plan.   ___________________________________________   FINAL CLINICAL IMPRESSION(S) / ED DIAGNOSES   Final diagnoses:  Sepsis, due to unspecified organism [facility])              Note: This dictation was prepared with Dragon dictation. Any transcriptional errors that result from this process are unintentional    Governor Rooks, MD 03/23/17 1625

## 2017-03-23 NOTE — H&P (Signed)
[facility] - [place] at [facility]   PATIENT NAME: Randy Harris    MR#:  [id number]  DATE OF BIRTH:  [birth year]  DATE OF ADMISSION:  03/23/2017  PRIMARY CARE PHYSICIAN: Rayetta Humphrey, MD   REQUESTING/REFERRING PHYSICIAN: Dr. Governor Rooks  CHIEF COMPLAINT:   Chief Complaint  Patient presents with  . Fever  . Abscess    HISTORY OF PRESENT ILLNESS:  Randy Harris  is a 36 y.o. male with a known history of non-insulin-dependent diabetes mellitus, morbid obesity presents to hospital secondary to ongoing fevers and left thigh pain that started this morning. Patient states he was feeling completely normal last night when he went to bed. He woke up with chills, body aches and pain and erythema in the medial left thigh/groin area. He has had cellulitis in the same leg in the past. He is tachycardic, febrile here and also has a white count of 34,000. Denies any nausea or vomiting. No recent travel. No exposure to sick contacts.denies any insect bites. No headaches, no neck stiffness for photosensitivity. Denies any tick bites. He is being admitted for sepsis, unknown source at this time. Denies any diarrhea.  PAST MEDICAL HISTORY:   Past Medical History:  Diagnosis Date  . Diabetes mellitus without complication (HCC)     PAST SURGICAL HISTORY:   Past Surgical History:  Procedure Laterality Date  . PENILE CYST REMOVAL      SOCIAL HISTORY:   Social History  Substance Use Topics  . Smoking status: Current Every Day Smoker    Packs/day: 0.50    Types: Cigarettes  . Smokeless tobacco: Never Used  . Alcohol use No    FAMILY HISTORY:   Family History  Problem Relation Age of Onset  . CAD Father     DRUG ALLERGIES:  No Known Allergies  REVIEW OF SYSTEMS:   Review of Systems  Constitutional: Positive for fever and malaise/fatigue. Negative for chills and weight loss.  HENT: Negative for ear discharge, ear pain, hearing loss and nosebleeds.     Eyes: Negative for blurred vision, double vision and photophobia.  Respiratory: Negative for cough, hemoptysis, shortness of breath and wheezing.   Cardiovascular: Negative for chest pain, palpitations, orthopnea and leg swelling.  Gastrointestinal: Negative for abdominal pain, constipation, diarrhea, heartburn, melena, nausea and vomiting.  Genitourinary: Negative for dysuria, frequency, hematuria and urgency.  Musculoskeletal: Positive for myalgias. Negative for back pain and neck pain.  Skin: Negative for rash.  Neurological: Negative for dizziness, tingling, tremors, sensory change, speech change, focal weakness and headaches.  Endo/Heme/Allergies: Does not bruise/bleed easily.  Psychiatric/Behavioral: Negative for depression.    MEDICATIONS AT HOME:   Prior to Admission medications   Medication Sig Start Date End Date Taking? Authorizing Provider  glipiZIDE (GLUCOTROL XL) 10 MG 24 hr tablet Take 20 mg by mouth daily with breakfast.  08/05/16 08/05/17 Yes [provider]  metFORMIN (GLUCOPHAGE-XR) 500 MG 24 hr tablet Take 1,000 mg by mouth daily with breakfast.  08/05/16 08/05/17 Yes [provider]  lisinopril (PRINIVIL,ZESTRIL) 5 MG tablet Take by mouth. 08/05/16 08/05/17  [provider]  metroNIDAZOLE (FLAGYL) 500 MG tablet Take 1 tablet (500 mg total) by mouth 3 (three) times daily. Patient not taking: Reported on 03/23/2017 08/22/15   Emily Filbert, MD  oxyCODONE-acetaminophen (PERCOCET) 5-325 MG tablet Take 2 tablets by mouth every 6 (six) hours as needed for moderate pain or severe pain. Patient not taking: Reported on 03/23/2017 08/22/15   Daryel November  E, MD  oxyCODONE-acetaminophen (ROXICET) 5-325 MG per tablet Take 1 tablet by mouth every 6 (six) hours as needed. Patient not taking: Reported on 03/23/2017 12/06/14   Emily FilbertWilliams, Jonathan E, MD  pravastatin (PRAVACHOL) 40 MG tablet Take by mouth. 08/05/16 08/05/17  [provider]   sulfamethoxazole-trimethoprim (BACTRIM DS) 800-160 MG per tablet Take 1 tablet by mouth 2 (two) times daily. Patient not taking: Reported on 03/23/2017 12/06/14   Emily FilbertWilliams, Jonathan E, MD      VITAL SIGNS:  Blood pressure (!) 120/54, pulse (!) 124, temperature 100.2 F (37.9 C), temperature source Oral, resp. rate (!) 24, height 5\' 9"  (1.753 m), weight (!) 167.8 kg (370 lb), SpO2 95 %.  PHYSICAL EXAMINATION:   Physical Exam  GENERAL:  36 y.o.-year-old obese patient lying in the bed with no acute distress.  EYES: Pupils equal, round, reactive to light and accommodation. No scleral icterus. Extraocular muscles intact.  HEENT: Head atraumatic, normocephalic. Oropharynx and nasopharynx clear.  NECK:  Supple, no jugular venous distention. No thyroid enlargement, no tenderness.  LUNGS: Normal breath sounds bilaterally, no wheezing, rales,rhonchi or crepitation. No use of accessory muscles of respiration. Decreased bibasilar breath sounds CARDIOVASCULAR: S1, S2 normal. No murmurs, rubs, or gallops.  ABDOMEN: Soft, nontender, nondistended. Bowel sounds present. No organomegaly or mass.  EXTREMITIES: No pedal edema, cyanosis, or clubbing. Tenderness to touch in medial proximal left thigh. No inguinal lymphadenopathy noted. NEUROLOGIC: Cranial nerves II through XII are intact. Muscle strength 5/5 in all extremities. Sensation intact. Gait not checked.  PSYCHIATRIC: The patient is alert and oriented x 3.  SKIN: No obvious rash, lesion, or ulcer.   LABORATORY PANEL:   CBC  Recent Labs Lab 03/23/17 1521  WBC 34.2*  HGB 14.6  HCT 43.3  PLT 331   ------------------------------------------------------------------------------------------------------------------  Chemistries   Recent Labs Lab 03/23/17 1521  NA 136  K 3.8  CL 104  CO2 23  GLUCOSE 213*  BUN 10  CREATININE 1.32*  CALCIUM 8.7*  AST 27  ALT 26  ALKPHOS 54  BILITOT 0.6    ------------------------------------------------------------------------------------------------------------------  Cardiac Enzymes No results for input(s): TROPONINI in the last 168 hours. ------------------------------------------------------------------------------------------------------------------  RADIOLOGY:  Dg Chest 2 View  Result Date: 03/23/2017 CLINICAL DATA:  Fever since last night. EXAM: CHEST  2 VIEW COMPARISON:  05/28/2007 FINDINGS: The heart size and mediastinal contours are within normal limits. Both lungs are clear. The visualized skeletal structures are unremarkable. IMPRESSION: No active cardiopulmonary disease. Electronically Signed   By: Elige KoHetal  Patel   On: 03/23/2017 15:06    EKG:   Orders placed or performed in visit on 06/04/13  . EKG 12-Lead    IMPRESSION AND PLAN:   Randy Harris  is a 36 y.o. male with a known history of non-insulin-dependent diabetes mellitus, morbid obesity presents to hospital secondary to ongoing fevers and left thigh pain that started this morning.  #1 sepsis-unknown source at this time. -Could be early cellulitis as he does have left medial thigh pain- I do not see any erythema -Urine analysis is pending. Chest x-rays clear. -Blood cultures are drawn. Likely bacterial infection with 89% neutrophils -Continue broad-spectrum antibiotics with vancomycin and Zosyn at this time.  #2 NIDDM- a1c, SSI Metformin and glipizide Last outpatient a1c around 6  #3 Morbid obesity- life style changes  #4 Mild acute renal insufficiency- likely ATN from sepsis Harris hydration  #5 DVT Prophylaxis- heparin SQ   All the records are reviewed and case discussed with ED provider. Management plans  discussed with the patient, family and they are in agreement.  CODE STATUS: Full Code  TOTAL TIME TAKING CARE OF THIS PATIENT: 50 minutes.    Enid Baas M.D on 03/23/2017 at 6:07 PM  Between 7am to 6pm - Pager - [phone]  After  6pm go to [email or web address] - password [email or web address]  [facility]  Office  [phone]  CC: Primary care physician; Rayetta Humphrey, MD

## 2017-03-23 NOTE — ED Triage Notes (Signed)
Pt states he woke with redness, pain and swelling groin area with fever and bodyaches. States he has a hx fo cellulitis in the past and with the same leg.. Denies any issues yesterday..Marland Kitchen

## 2017-03-23 NOTE — ED Notes (Signed)
2 unsuccessful attempts to start IV

## 2017-03-23 NOTE — Progress Notes (Signed)
Pharmacy Antibiotic Note  Randy MunchJohnny Harris is a 36 y.o. male admitted on 03/23/2017 with sepsis and cellulitis.  Pharmacy has been consulted for vancomycin zosyn dosing.  Plan: Vancomycin 1500mg  IV every 8 hours.  Goal trough 10-15 mcg/mL. Zosyn 3.375g IV q8h (4 hour infusion).  Height: 5\' 9"  (175.3 cm) Weight: (!) 370 lb (167.8 kg) IBW/kg (Calculated) : 70.7  Temp (24hrs), Avg:102.1 F (38.9 C), Min:101.1 F (38.4 C), Max:103.1 F (39.5 C)   Recent Labs Lab 03/23/17 1521  WBC 34.2*  CREATININE 1.32*  LATICACIDVEN 2.5*    Estimated Creatinine Clearance: 118.7 mL/min (A) (by C-G formula based on SCr of 1.32 mg/dL (H)).    No Known Allergies  Antimicrobials this admission: 9/9 vanco >>  9/9 zosyn >>  Microbiology results: 9/9 BCx: pending x 2  9/9 UCx: pending    Thank you for allowing pharmacy to be a part of this patient's care.  Gerre PebblesGarrett Samarie Pinder 03/23/2017 4:38 PM

## 2017-03-23 NOTE — ED Notes (Signed)
Patient is a difficult stick.  Difficulty drawing blood and starting IV.

## 2017-03-23 NOTE — ED Notes (Signed)
Pt still unable to give urine specimen. Verbalized understanding that we need a sample.

## 2017-03-24 LAB — URINALYSIS, COMPLETE (UACMP) WITH MICROSCOPIC
Bilirubin Urine: NEGATIVE
Glucose, UA: 150 mg/dL — AB
Hgb urine dipstick: NEGATIVE
KETONES UR: NEGATIVE mg/dL
Leukocytes, UA: NEGATIVE
Nitrite: NEGATIVE
PROTEIN: NEGATIVE mg/dL
Specific Gravity, Urine: 1.013 (ref 1.005–1.030)
pH: 6 (ref 5.0–8.0)

## 2017-03-24 LAB — CBC
HCT: 40.6 % (ref 40.0–52.0)
Hemoglobin: 13.5 g/dL (ref 13.0–18.0)
MCH: 29.8 pg (ref 26.0–34.0)
MCHC: 33.3 g/dL (ref 32.0–36.0)
MCV: 89.5 fL (ref 80.0–100.0)
PLATELETS: 280 10*3/uL (ref 150–440)
RBC: 4.53 MIL/uL (ref 4.40–5.90)
RDW: 14.5 % (ref 11.5–14.5)
WBC: 24.6 10*3/uL — AB (ref 3.8–10.6)

## 2017-03-24 LAB — BASIC METABOLIC PANEL
Anion gap: 6 (ref 5–15)
BUN: 12 mg/dL (ref 6–20)
CALCIUM: 8.3 mg/dL — AB (ref 8.9–10.3)
CO2: 24 mmol/L (ref 22–32)
Chloride: 108 mmol/L (ref 101–111)
Creatinine, Ser: 1.06 mg/dL (ref 0.61–1.24)
Glucose, Bld: 186 mg/dL — ABNORMAL HIGH (ref 65–99)
POTASSIUM: 3.4 mmol/L — AB (ref 3.5–5.1)
SODIUM: 138 mmol/L (ref 135–145)

## 2017-03-24 LAB — VANCOMYCIN, TROUGH: VANCOMYCIN TR: 7 ug/mL — AB (ref 15–20)

## 2017-03-24 LAB — MRSA PCR SCREENING: MRSA BY PCR: NEGATIVE

## 2017-03-24 LAB — GLUCOSE, CAPILLARY
GLUCOSE-CAPILLARY: 161 mg/dL — AB (ref 65–99)
GLUCOSE-CAPILLARY: 199 mg/dL — AB (ref 65–99)
Glucose-Capillary: 127 mg/dL — ABNORMAL HIGH (ref 65–99)

## 2017-03-24 LAB — HEMOGLOBIN A1C
Hgb A1c MFr Bld: 7.4 % — ABNORMAL HIGH (ref 4.8–5.6)
MEAN PLASMA GLUCOSE: 165.68 mg/dL

## 2017-03-24 MED ORDER — ENOXAPARIN SODIUM 40 MG/0.4ML ~~LOC~~ SOLN
40.0000 mg | SUBCUTANEOUS | Status: DC
  Administered 2017-03-24: 40 mg via SUBCUTANEOUS
  Filled 2017-03-24: qty 0.4

## 2017-03-24 MED ORDER — KETOROLAC TROMETHAMINE 30 MG/ML IJ SOLN
30.0000 mg | Freq: Once | INTRAMUSCULAR | Status: AC
  Administered 2017-03-24: 30 mg via INTRAVENOUS
  Filled 2017-03-24: qty 1

## 2017-03-24 NOTE — Progress Notes (Signed)
[facility] - [place] at [facility]   PATIENT NAME: Randy MunchJohnny Harris    MR#:  [id number]  DATE OF BIRTH:  1980/08/17  SUBJECTIVE:   Patient here due to sepsis suspected secondary to cellulitis. Afebrile overnight, hemodynamically stable. White cell count has improved. No other acute complaints or events overnight.  REVIEW OF SYSTEMS:    Review of Systems  Constitutional: Negative for chills and fever.  HENT: Negative for congestion and tinnitus.   Eyes: Negative for blurred vision and double vision.  Respiratory: Negative for cough, shortness of breath and wheezing.   Cardiovascular: Negative for chest pain, orthopnea and PND.  Gastrointestinal: Negative for abdominal pain, diarrhea, nausea and vomiting.  Genitourinary: Negative for dysuria and hematuria.  Neurological: Negative for dizziness, sensory change and focal weakness.  All other systems reviewed and are negative.   Nutrition: Heart Healthy/Carb control Tolerating Diet: Yes Tolerating PT: Ambulatory   DRUG ALLERGIES:  No Known Allergies  VITALS:  Blood pressure (!) 120/52, pulse 95, temperature 98.9 F (37.2 C), temperature source Oral, resp. rate 14, height 5\' 9"  (1.753 m), weight 105.7 kg (233 lb), SpO2 98 %.  PHYSICAL EXAMINATION:   Physical Exam  GENERAL:  36 y.o.-year-old obese patient lying in bed in no acute distress.  EYES: Pupils equal, round, reactive to light and accommodation. No scleral icterus. Extraocular muscles intact.  HEENT: Head atraumatic, normocephalic. Oropharynx and nasopharynx clear.  NECK:  Supple, no jugular venous distention. No thyroid enlargement, no tenderness.  LUNGS: Normal breath sounds bilaterally, no wheezing, rales, rhonchi. No use of accessory muscles of respiration.  CARDIOVASCULAR: S1, S2 normal. No murmurs, rubs, or gallops.  ABDOMEN: Soft, nontender, nondistended. Bowel sounds present. No organomegaly or mass.  EXTREMITIES: No cyanosis, clubbing or  edema b/l.    NEUROLOGIC: Cranial nerves II through XII are intact. No focal Motor or sensory deficits b/l.   PSYCHIATRIC: The patient is alert and oriented x 3.  SKIN: No obvious rash, lesion, or ulcer.    LABORATORY PANEL:   CBC  Recent Labs Lab 03/24/17 0306  WBC 24.6*  HGB 13.5  HCT 40.6  PLT 280   ------------------------------------------------------------------------------------------------------------------  Chemistries   Recent Labs Lab 03/23/17 1521 03/24/17 0306  NA 136 138  K 3.8 3.4*  CL 104 108  CO2 23 24  GLUCOSE 213* 186*  BUN 10 12  CREATININE 1.32* 1.06  CALCIUM 8.7* 8.3*  AST 27  --   ALT 26  --   ALKPHOS 54  --   BILITOT 0.6  --    ------------------------------------------------------------------------------------------------------------------  Cardiac Enzymes No results for input(s): TROPONINI in the last 168 hours. ------------------------------------------------------------------------------------------------------------------  RADIOLOGY:  Dg Chest 2 View  Result Date: 03/23/2017 CLINICAL DATA:  Fever since last night. EXAM: CHEST  2 VIEW COMPARISON:  05/28/2007 FINDINGS: The heart size and mediastinal contours are within normal limits. Both lungs are clear. The visualized skeletal structures are unremarkable. IMPRESSION: No active cardiopulmonary disease. Electronically Signed   By: Elige KoHetal  Patel   On: 03/23/2017 15:06     ASSESSMENT AND PLAN:   Randy Harris  is a 36 y.o. male with a known history of non-insulin-dependent diabetes mellitus, morbid obesity presents to hospital secondary to ongoing fevers and left thigh pain that started this morning.  #1 sepsis-unknown source at this time. -Could be early cellulitis as he does have left medial thigh pain and he says this is exactly what happened last time when he had cellulitis.  - UA still pending, CXR (-).  MRSA PCR (-) and will d/c Vanc and cont. Zosyn for now.  - WBC count  trending down.  BC (-) so far.   #2 NIDDM- A1c was 7.4  - cont. Metformin, Glipizide and SSI and follow BS.   #3 Hyperlipidemia - cont. Pravachol.   # 4 HTN - cont. Lisinopril.   #5 Mild acute renal insufficiency- likely ATN from sepsis - improved w/ IV fluid hydration.   Will d/c home tomorrow if WBC count trending down and remains afebrile.    All the records are reviewed and case discussed with Care Management/Social Worker. Management plans discussed with the patient, family and they are in agreement.  CODE STATUS: Full code  DVT Prophylaxis: Lovenox  TOTAL TIME TAKING CARE OF THIS PATIENT: 30 minutes.   POSSIBLE D/C IN 1-2 DAYS, DEPENDING ON CLINICAL CONDITION.   Houston Siren M.D on 03/24/2017 at 2:23 PM  Between 7am to 6pm - Pager - [phone]  After 6pm go to [email or web address] - Social research officer, government  [facility] [place] [facility]  Office  [phone]  CC: Primary care physician; Rayetta Humphrey, MD

## 2017-03-25 LAB — URINE CULTURE: Culture: NO GROWTH

## 2017-03-25 LAB — CBC
HEMATOCRIT: 38.2 % — AB (ref 40.0–52.0)
Hemoglobin: 12.9 g/dL — ABNORMAL LOW (ref 13.0–18.0)
MCH: 29.8 pg (ref 26.0–34.0)
MCHC: 33.7 g/dL (ref 32.0–36.0)
MCV: 88.2 fL (ref 80.0–100.0)
PLATELETS: 273 10*3/uL (ref 150–440)
RBC: 4.33 MIL/uL — AB (ref 4.40–5.90)
RDW: 14.1 % (ref 11.5–14.5)
WBC: 11.6 10*3/uL — AB (ref 3.8–10.6)

## 2017-03-25 LAB — GLUCOSE, CAPILLARY: Glucose-Capillary: 138 mg/dL — ABNORMAL HIGH (ref 65–99)

## 2017-03-25 LAB — HIV ANTIBODY (ROUTINE TESTING W REFLEX): HIV SCREEN 4TH GENERATION: NONREACTIVE

## 2017-03-25 MED ORDER — AMOXICILLIN-POT CLAVULANATE 875-125 MG PO TABS: 1.0000 | ORAL_TABLET | Freq: Two times a day (BID) | ORAL | 0 refills | Status: AC

## 2017-03-25 NOTE — Discharge Summary (Signed)
[place] at [place] NAME: Randy Harris    MR#:  [id number]  DATE OF BIRTH:  Nov 21, 1980  DATE OF ADMISSION:  03/23/2017 ADMITTING PHYSICIAN: Gladstone Lighter, MD  DATE OF DISCHARGE: 03/25/2017 10:30 AM  PRIMARY CARE PHYSICIAN: Sharyne Peach, MD    ADMISSION DIAGNOSIS:  Sepsis, due to unspecified organism ([place]) [A41.9]  DISCHARGE DIAGNOSIS:  Active Problems:   Sepsis ([place])   SECONDARY DIAGNOSIS:   Past Medical History:  Diagnosis Date  . Diabetes mellitus without complication [facility])     HOSPITAL COURSE:   Randy Wat[place]is a 36 y.o. malewith a known history of non-insulin-dependent diabetes mellitus, morbid obesity presents to hospital secondary to ongoing fevers and left thigh pain that started this morning.  #1 sepsis- patient met criteria admission with leukocytosis, fever, tachycardia. Source of the sepsis was unclear but suspected to be cellulitis as he had similar presentations in the past. Patient complaining of some left inguinal pain with some induration. -Patient's urinalysis was negative, his chest x-ray was negative for pneumonia. Initially patient was empirically treated with IV vancomycin, Zosyn. Antibiotics were eventually narrowed down to just Zosyn. He is now being discharged on oral Augmentin for additional 5 days. His white cell count has normalized and he is currently afebrile and hemodynamically stable. -his blood cultures remained negative.  #2 NIDDM- his A1c was 7.4  - he will cont. Metformin, Glipizide upon discharge.   #3 Hyperlipidemia - he will cont. Pravachol.   # 4 HTN - he will cont. Lisinopril.   #5 Mild acute renal insufficiency- likely ATN from sepsis - this has improved and resolved w/ IV fluid hydration.   DISCHARGE CONDITIONS:   Stable.   CONSULTS OBTAINED:    DRUG ALLERGIES:  No Known Allergies  DISCHARGE MEDICATIONS:   Allergies as of 03/25/2017   No Known  Allergies     Medication List    STOP taking these medications   metroNIDAZOLE 500 MG tablet Commonly known as:  FLAGYL   oxyCODONE-acetaminophen 5-325 MG tablet Commonly known as:  PERCOCET   sulfamethoxazole-trimethoprim 800-160 MG tablet Commonly known as:  BACTRIM DS     TAKE these medications   amoxicillin-clavulanate 875-125 MG tablet Commonly known as:  AUGMENTIN Take 1 tablet by mouth 2 (two) times daily.   glipiZIDE 10 MG 24 hr tablet Commonly known as:  GLUCOTROL XL Take 20 mg by mouth daily with breakfast.   lisinopril 5 MG tablet Commonly known as:  PRINIVIL,ZESTRIL Take by mouth.   metFORMIN 500 MG 24 hr tablet Commonly known as:  GLUCOPHAGE-XR Take 1,000 mg by mouth daily with breakfast.   pravastatin 40 MG tablet Commonly known as:  PRAVACHOL Take by mouth.            Discharge Care Instructions        Start     Ordered   03/25/17 0000  Activity as tolerated - No restrictions     03/25/17 1001   03/25/17 0000  Diet - low sodium heart healthy     03/25/17 1001   03/25/17 0000  Diet Carb Modified     03/25/17 1001   03/25/17 0000  amoxicillin-clavulanate (AUGMENTIN) 875-125 MG tablet  2 times daily     03/25/17 1001        DISCHARGE INSTRUCTIONS:   DIET:  Cardiac diet and Diabetic diet  DISCHARGE CONDITION:  Stable  ACTIVITY:  Activity as tolerated  OXYGEN:  Home Oxygen: No.   Oxygen  Delivery: room air  DISCHARGE LOCATION:  home   If you experience worsening of your admission symptoms, develop shortness of breath, life threatening emergency, suicidal or homicidal thoughts you must seek medical attention immediately by calling 911 or calling your MD immediately  if symptoms less severe.  You Must read complete instructions/literature along with all the possible adverse reactions/side effects for all the Medicines you take and that have been prescribed to you. Take any new Medicines after you have completely understood and  accpet all the possible adverse reactions/side effects.   Please note  You were cared for by a hospitalist during your hospital stay. If you have any questions about your discharge medications or the care you received while you were in the hospital after you are discharged, you can call the unit and asked to speak with the hospitalist on call if the hospitalist that took care of you is not available. Once you are discharged, your primary care physician will handle any further medical issues. Please note that NO REFILLS for any discharge medications will be authorized once you are discharged, as it is imperative that you return to your primary care physician (or establish a relationship with a primary care physician if you do not have one) for your aftercare needs so that they can reassess your need for medications and monitor your lab values.     Today   Febrile, white cell count much improved. Still has some left-sided inguinal pain but much improved since admission.  VITAL SIGNS:  Blood pressure 123/71, pulse 97, temperature 98.5 F (36.9 C), temperature source Oral, resp. rate 12, height 5' 9"  (1.753 m), weight 105.7 kg (233 lb), SpO2 98 %.  I/O:   Intake/Output Summary (Last 24 hours) at 03/25/17 1557 Last data filed at 03/25/17 0800  Gross per 24 hour  Intake             1170 ml  Output                0 ml  Net             1170 ml    PHYSICAL EXAMINATION:    GENERAL:  36 y.o.-year-old obese patient lying in bed in no acute distress.  EYES: Pupils equal, round, reactive to light and accommodation. No scleral icterus. Extraocular muscles intact.  HEENT: Head atraumatic, normocephalic. Oropharynx and nasopharynx clear.  NECK:  Supple, no jugular venous distention. No thyroid enlargement, no tenderness.  LUNGS: Normal breath sounds bilaterally, no wheezing, rales, rhonchi. No use of accessory muscles of respiration.  CARDIOVASCULAR: S1, S2 normal. No murmurs, rubs, or gallops.   ABDOMEN: Soft, nontender, nondistended. Bowel sounds present. No organomegaly or mass.  EXTREMITIES: No cyanosis, clubbing or edema b/l.    NEUROLOGIC: Cranial nerves II through XII are intact. No focal Motor or sensory deficits b/l.   PSYCHIATRIC: The patient is alert and oriented x 3.  SKIN: No obvious rash, lesion, or ulcer.   DATA REVIEW:   CBC  Recent Labs Lab 03/25/17 0345  WBC 11.6*  HGB 12.9*  HCT 38.2*  PLT 273    Chemistries   Recent Labs Lab 03/23/17 1521 03/24/17 0306  NA 136 138  K 3.8 3.4*  CL 104 108  CO2 23 24  GLUCOSE 213* 186*  BUN 10 12  CREATININE 1.32* 1.06  CALCIUM 8.7* 8.3*  AST 27  --   ALT 26  --   ALKPHOS 54  --   BILITOT 0.6  --  Cardiac Enzymes No results for input(s): TROPONINI in the last 168 hours.  Microbiology Results  Results for orders placed or performed during the hospital encounter of 03/23/17  Culture, blood (Routine x 2)     Status: None (Preliminary result)   Collection Time: 03/23/17  3:10 PM  Result Value Ref Range Status   Specimen Description BLOOD RIGHT ASSIST CONTROL  Final   Special Requests   Final    BOTTLES DRAWN AEROBIC AND ANAEROBIC Blood Culture results may not be optimal due to an excessive volume of blood received in culture bottles   Culture NO GROWTH 2 DAYS  Final   Report Status PENDING  Incomplete  Culture, blood (Routine x 2)     Status: None (Preliminary result)   Collection Time: 03/23/17  3:21 PM  Result Value Ref Range Status   Specimen Description BLOOD RIGHT ASSIST CONTROL  Final   Special Requests   Final    BOTTLES DRAWN AEROBIC AND ANAEROBIC Blood Culture results may not be optimal due to an excessive volume of blood received in culture bottles   Culture NO GROWTH 2 DAYS  Final   Report Status PENDING  Incomplete  MRSA PCR Screening     Status: None   Collection Time: 03/24/17  9:30 AM  Result Value Ref Range Status   MRSA by PCR NEGATIVE NEGATIVE Final    Comment:        The  GeneXpert MRSA Assay (FDA approved for NASAL specimens only), is one component of a comprehensive MRSA colonization surveillance program. It is not intended to diagnose MRSA infection nor to guide or monitor treatment for MRSA infections.   Urine culture     Status: None   Collection Time: 03/24/17  9:50 AM  Result Value Ref Range Status   Specimen Description URINE, RANDOM  Final   Special Requests NONE  Final   Culture   Final    NO GROWTH Performed at [facility], [address], [place], [place] [address]    Report Status 03/25/2017 FINAL  Final    RADIOLOGY:  No results found.    Management plans discussed with the patient, family and they are in agreement.  CODE STATUS:  Code Status History    Date Active Date Inactive Code Status Order ID Comments User Context   03/23/2017  8:36 PM 03/25/2017  1:46 PM Full Code [id number]  Gladstone Lighter, MD Inpatient      TOTAL TIME TAKING CARE OF THIS PATIENT: 40 minutes.    Henreitta Leber M.D on 03/25/2017 at 3:57 PM  Between 7am to 6pm - Pager - [phone]  After 6pm go to [email or web address] - Proofreader  [facility] [place] [facility]  Office  [phone]  CC: Primary care physician; Sharyne Peach, MD

## 2017-03-25 NOTE — Progress Notes (Signed)
[facility]    Randy Harris was admitted to the Hospital on 03/23/2017 and Discharged  03/25/2017 and should be excused from work/school   for 5  days starting 03/23/2017 , may return to work/school without any restrictions.  Call Randy LiasVivek Yvone Slape MD, [facility]  [phone] with questions.  Randy SirenSAINANI,[name] J M.D on 03/25/2017,at 10:02 AM

## 2017-03-25 NOTE — Progress Notes (Signed)
Patient is alert and oriented and able to verbalize needs. No complaints of pain at this time. Vital signs stable. PIV removed. Discharge instructions gone over with patient at this time. Printed AVS, work note and rx for abt given to patient. No concerns voiced at this time. All belongings packed up. Resident ambulated to front with nurse. Family member transported patient home.   Suzan SlickAlison L Romen Yutzy, RN

## 2017-03-28 LAB — CULTURE, BLOOD (ROUTINE X 2)
CULTURE: NO GROWTH
Culture: NO GROWTH

## 2017-07-02 ENCOUNTER — Emergency Department: Payer: 59

## 2017-07-02 ENCOUNTER — Other Ambulatory Visit: Payer: Self-pay

## 2017-07-02 ENCOUNTER — Emergency Department
Admission: EM | Admit: 2017-07-02 | Discharge: 2017-07-02 | Disposition: A | Discharge status: Home / Self Care | Payer: 59 | Attending: Emergency Medicine | Admitting: Emergency Medicine

## 2017-07-02 DIAGNOSIS — Z79899 Other long term (current) drug therapy: Secondary | ICD-10-CM | POA: Insufficient documentation

## 2017-07-02 DIAGNOSIS — K5732 Diverticulitis of large intestine without perforation or abscess without bleeding: Secondary | ICD-10-CM | POA: Diagnosis not present

## 2017-07-02 DIAGNOSIS — E119 Type 2 diabetes mellitus without complications: Secondary | ICD-10-CM | POA: Insufficient documentation

## 2017-07-02 DIAGNOSIS — Z7984 Long term (current) use of oral hypoglycemic drugs: Secondary | ICD-10-CM | POA: Diagnosis not present

## 2017-07-02 DIAGNOSIS — R1030 Lower abdominal pain, unspecified: Secondary | ICD-10-CM | POA: Diagnosis present

## 2017-07-02 DIAGNOSIS — F17210 Nicotine dependence, cigarettes, uncomplicated: Secondary | ICD-10-CM | POA: Insufficient documentation

## 2017-07-02 LAB — COMPREHENSIVE METABOLIC PANEL
ALBUMIN: 3.4 g/dL — AB (ref 3.5–5.0)
ALK PHOS: 69 U/L (ref 38–126)
ALT: 30 U/L (ref 17–63)
ANION GAP: 3 — AB (ref 5–15)
AST: 21 U/L (ref 15–41)
BUN: 10 mg/dL (ref 6–20)
CALCIUM: 8.2 mg/dL — AB (ref 8.9–10.3)
CHLORIDE: 108 mmol/L (ref 101–111)
CO2: 24 mmol/L (ref 22–32)
Creatinine, Ser: 0.85 mg/dL (ref 0.61–1.24)
GFR calc non Af Amer: >60 mL/min (ref >60)
GLUCOSE: 109 mg/dL — AB (ref 65–99)
POTASSIUM: 3.8 mmol/L (ref 3.5–5.1)
SODIUM: 135 mmol/L (ref 135–145)
Total Bilirubin: 0.4 mg/dL (ref 0.3–1.2)
Total Protein: 7.1 g/dL (ref 6.5–8.1)

## 2017-07-02 LAB — URINALYSIS, COMPLETE (UACMP) WITH MICROSCOPIC
BACTERIA UA: NONE SEEN
BILIRUBIN URINE: NEGATIVE
GLUCOSE, UA: NEGATIVE mg/dL
HGB URINE DIPSTICK: NEGATIVE
KETONES UR: NEGATIVE mg/dL
LEUKOCYTES UA: NEGATIVE
NITRITE: NEGATIVE
PROTEIN: NEGATIVE mg/dL
Specific Gravity, Urine: 1.019 (ref 1.005–1.030)
Squamous Epithelial / LPF: NONE SEEN
pH: 6 (ref 5.0–8.0)

## 2017-07-02 LAB — CBC
HCT: 46.6 % (ref 40.0–52.0)
HEMOGLOBIN: 15.2 g/dL (ref 13.0–18.0)
MCH: 29.1 pg (ref 26.0–34.0)
MCHC: 32.7 g/dL (ref 32.0–36.0)
MCV: 89 fL (ref 80.0–100.0)
PLATELETS: 436 10*3/uL (ref 150–440)
RBC: 5.24 MIL/uL (ref 4.40–5.90)
RDW: 14.9 % — ABNORMAL HIGH (ref 11.5–14.5)
WBC: 15.9 10*3/uL — ABNORMAL HIGH (ref 3.8–10.6)

## 2017-07-02 LAB — LIPASE, BLOOD: Lipase: 40 U/L (ref 11–51)

## 2017-07-02 MED ORDER — IOPAMIDOL (ISOVUE-300) INJECTION 61%
30.0000 mL | Freq: Once | INTRAVENOUS | Status: AC | PRN
  Administered 2017-07-02: 30 mL via ORAL

## 2017-07-02 MED ORDER — DOCUSATE SODIUM 100 MG PO CAPS: ORAL_CAPSULE | ORAL | 0 refills | Status: AC

## 2017-07-02 MED ORDER — AMOXICILLIN-POT CLAVULANATE 875-125 MG PO TABS: 1.0000 | ORAL_TABLET | Freq: Three times a day (TID) | ORAL | 0 refills | Status: AC

## 2017-07-02 MED ORDER — ONDANSETRON 4 MG PO TBDP: ORAL_TABLET | ORAL | 0 refills | Status: AC

## 2017-07-02 MED ORDER — IOPAMIDOL (ISOVUE-300) INJECTION 61%
125.0000 mL | Freq: Once | INTRAVENOUS | Status: AC | PRN
  Administered 2017-07-02: 125 mL via INTRAVENOUS

## 2017-07-02 MED ORDER — AMOXICILLIN-POT CLAVULANATE 875-125 MG PO TABS
1.0000 | ORAL_TABLET | Freq: Once | ORAL | Status: AC
Start: 2017-07-02 — End: 2017-07-02
  Administered 2017-07-02: 1 via ORAL
  Filled 2017-07-02: qty 1

## 2017-07-02 NOTE — ED Notes (Signed)
ED Provider at bedside. 

## 2017-07-02 NOTE — ED Provider Notes (Signed)
[facility] Emergency Department Provider Note  ____________________________________________   First MD Initiated Contact with Patient 07/02/17 2129     (approximate)  I have reviewed the triage vital signs and the nursing notes.   HISTORY  Chief Complaint Abdominal Pain    HPI Randy Harris is a 36 y.o. male with a history of uncomplicated diabetes as well as a prior episode of diverticulitis which improved with antibiotics.  Presents for evaluation of gradually worsening lower abdominal pain over the last 3 days.  He says it is still milder may be moderate but moving around makes it worse.  Resting makes it better.  He describes as an aching and intermittent cramping pain.  He is also not been able to have a bowel movement for several days.  He has not had an appetite but he has been making himself eat and drink.  He denies fever/chills, chest pain, shortness of breath, nausea, and vomiting.  Past Medical History:  Diagnosis Date  . Diabetes mellitus without complication [facility](HCC)     Patient Active Problem List   Diagnosis Date Noted  . Sepsis (HCC) 03/23/2017    Past Surgical History:  Procedure Laterality Date  . PENILE CYST REMOVAL      Prior to Admission medications   Medication Sig Start Date End Date Taking? Authorizing Provider  amoxicillin-clavulanate (AUGMENTIN) 875-125 MG tablet Take 1 tablet by mouth every 8 (eight) hours for 10 days. 07/02/17 07/12/17  Loleta RoseForbach, Aquita Simmering, MD  docusate sodium (COLACE) 100 MG capsule Take 1 tablet once or twice daily as needed for constipation 07/02/17   Loleta RoseForbach, Reedy Biernat, MD  glipiZIDE (GLUCOTROL XL) 10 MG 24 hr tablet Take 20 mg by mouth daily with breakfast.  08/05/16 08/05/17  [provider]  lisinopril (PRINIVIL,ZESTRIL) 5 MG tablet Take by mouth. 08/05/16 08/05/17  [provider]  metFORMIN (GLUCOPHAGE-XR) 500 MG 24 hr tablet Take 1,000 mg by mouth daily with breakfast.  08/05/16 08/05/17   [provider]  ondansetron (ZOFRAN ODT) 4 MG disintegrating tablet Allow 1-2 tablets to dissolve in your mouth every 8 hours as needed for nausea/vomiting 07/02/17   Loleta RoseForbach, Luverne Zerkle, MD  pravastatin (PRAVACHOL) 40 MG tablet Take by mouth. 08/05/16 08/05/17  [provider]    Allergies Patient has no known allergies.  Family History  Problem Relation Age of Onset  . CAD Father     Social History Social History   Tobacco Use  . Smoking status: Current Every Day Smoker    Packs/day: 0.50    Types: Cigarettes  . Smokeless tobacco: Never Used  Substance Use Topics  . Alcohol use: No  . Drug use: Not on file    Review of Systems Constitutional: No fever/chills Cardiovascular: Denies chest pain. Respiratory: Denies shortness of breath. Gastrointestinal: As listed above Genitourinary: Negative for dysuria. Musculoskeletal: Negative for neck pain.  Negative for back pain. Integumentary: Negative for rash. Neurological: Negative for headaches, focal weakness or numbness.   ____________________________________________   PHYSICAL EXAM:  VITAL SIGNS: ED Triage Vitals [07/02/17 1957]  Enc Vitals Group     BP (!) 183/99     Pulse Rate (!) 114     Resp 20     Temp 99 F (37.2 C)     Temp Source Oral     SpO2 98 %     Weight (!) 163.3 kg (360 lb)     Height 1.753 m (5\' 9" )     Head Circumference  Peak Flow      Pain Score 8     Pain Loc      Pain Edu?      Excl. in GC?     Constitutional: Alert and oriented. Well appearing and in no acute distress. Eyes: Conjunctivae are normal.  Head: Atraumatic. Nose: No congestion/rhinnorhea. Mouth/Throat: Mucous membranes are moist. Neck: No stridor.  No meningeal signs.   Cardiovascular: Normal rate, regular rhythm. Good peripheral circulation. Grossly normal heart sounds. Respiratory: Normal respiratory effort.  No retractions. Lungs CTAB. Gastrointestinal: Morbid obesity.  Mild tenderness to palpation  in the middle lower abdomen.  No rebound or guarding.  No distention. Musculoskeletal: No lower extremity tenderness nor edema. No gross deformities of extremities. Neurologic:  Normal speech and language. No gross focal neurologic deficits are appreciated.  Skin:  Skin is warm, dry and intact. No rash noted. Psychiatric: Mood and affect are normal. Speech and behavior are normal.  ____________________________________________   LABS (all labs ordered are listed, but only abnormal results are displayed)  Labs Reviewed  CBC - Abnormal; Notable for the following components:      Result Value   WBC 15.9 (*)    RDW 14.9 (*)    All other components within normal limits  COMPREHENSIVE METABOLIC PANEL - Abnormal; Notable for the following components:   Glucose, Bld 109 (*)    Calcium 8.2 (*)    Albumin 3.4 (*)    Anion gap 3 (*)    All other components within normal limits  URINALYSIS, COMPLETE (UACMP) WITH MICROSCOPIC - Abnormal; Notable for the following components:   Color, Urine YELLOW (*)    APPearance CLEAR (*)    All other components within normal limits  LIPASE, BLOOD   ____________________________________________  EKG  None - EKG not ordered by ED physician ____________________________________________  RADIOLOGY   Ct Abdomen Pelvis W Contrast  Result Date: 07/02/2017 CLINICAL DATA:  Lower abdominal pain and constipation EXAM: CT ABDOMEN AND PELVIS WITH CONTRAST TECHNIQUE: Multidetector CT imaging of the abdomen and pelvis was performed using the standard protocol following bolus administration of intravenous contrast. Oral contrast was administered. CONTRAST:  ISOVUE-300 IOPAMIDOL (ISOVUE-300) INJECTION 61% COMPARISON:  August 22, 2015 FINDINGS: Lower chest: There is a 3 mm nodular opacity in the medial segment right middle lobe, stable. Lung bases otherwise are clear. Hepatobiliary: There are no focal liver lesions. Gallbladder wall is not appreciably thickened.  There is no biliary duct dilatation. Pancreas: No pancreatic mass or inflammatory focus. Spleen: No splenic lesions are evident. Adrenals/Urinary Tract: Adrenals bilaterally appear normal. Kidneys bilaterally show no evident mass or hydronephrosis on either side. There is no renal or ureteral calculus on either side. Urinary bladder is midline with wall thickness within normal limits. Stomach/Bowel: There is wall thickening throughout the mid sigmoid colon with surrounding mesenteric thickening. There is no microperforation or evidence of abscess in the area of diverticulitis. Mild loculated fluid is noted in the pelvis lateral to the diverticulitis. Elsewhere, there is no bowel wall or mesenteric thickening. No bowel obstruction. No free air or portal venous air. Vascular/Lymphatic: There is mild aortic atherosclerotic calcification. No aneurysm in the aorta. Mesenteric vessels appear patent. No adenopathy evident in the abdomen or pelvis. Reproductive: Prostate and seminal vesicles are normal in size and contour. No pelvic mass evident. Other: The appendix appears normal. There is no abscess or ascites in the abdomen or pelvis. There is a small ventral hernia containing only fat. Musculoskeletal: There are no blastic or  lytic bone lesions. There is no intramuscular or abdominal wall lesion. IMPRESSION: 1. Sigmoid diverticulitis in the mid sigmoid colon region. There is wall thickening with mesenteric stranding and loculated fluid lateral to the diverticulitis in the pelvis. No abscess or perforation evident. 2. No bowel obstruction. No abscess elsewhere in the abdomen or pelvis. Appendix appears normal. 3.  No renal or ureteral calculus.  No hydronephrosis. 4.  Slight aortic atherosclerosis. 5.  Rather minimal ventral hernia containing only fat. Aortic Atherosclerosis (ICD10-I70.0). Electronically Signed   By: Bretta BangWilliam  Woodruff III M.D.   On: 07/02/2017 22:31     ____________________________________________   PROCEDURES  Critical Care performed: No   Procedure(s) performed:   Procedures   ____________________________________________   INITIAL IMPRESSION / ASSESSMENT AND PLAN / ED COURSE  As part of my medical decision making, I reviewed the following data within the electronic MEDICAL RECORD NUMBER Nursing notes reviewed and incorporated, Labs reviewed  and Old chart reviewed    Differential diagnosis includes, but is not limited to, acute appendicitis, renal colic, testicular torsion, urinary tract infection/pyelonephritis, prostatitis,  epididymitis, diverticulitis, small bowel obstruction or ileus, colitis, abdominal aortic aneurysm, gastroenteritis, hernia, etc. For this patient I suspect uncomplicated diverticulitis.  His cardia has resolved but he does have a leukocytosis of 15.9.  Metabolic panel is normal and urinalysis is normal.  Given his constipation is well, I will check a CT scan with oral and IV contrast (the p.o. contrast may help with constipation) and reassess.  The patient states he does not need pain medicine at this time.   Clinical Course as of Jul 02 2300  Wed Jul 02, 2017  2243 Uncomplicated sigmoid diverticulitis.  No evidence of perforation/abscess per radiologist's report.  Will discuss with patient and treat with outpatient antibiotics, stool softeners/laxatives, and give usual/customary return precautions. CT Abdomen Pelvis W Contrast [CF]    Clinical Course User Index [CF] Loleta RoseForbach, Nykia Turko, MD    ____________________________________________  FINAL CLINICAL IMPRESSION(S) / ED DIAGNOSES  Final diagnoses:  Sigmoid diverticulitis     MEDICATIONS GIVEN DURING THIS VISIT:  Medications  amoxicillin-clavulanate (AUGMENTIN) 875-125 MG per tablet 1 tablet (not administered)  iopamidol (ISOVUE-300) 61 % injection 30 mL (30 mLs Oral Contrast Given 07/02/17 2152)  iopamidol (ISOVUE-300) 61 % injection 125 mL (125  mLs Intravenous Contrast Given 07/02/17 2213)     ED Discharge Orders        Ordered    amoxicillin-clavulanate (AUGMENTIN) 875-125 MG tablet  Every 8 hours     07/02/17 2259    docusate sodium (COLACE) 100 MG capsule     07/02/17 2259    ondansetron (ZOFRAN ODT) 4 MG disintegrating tablet     07/02/17 2259       Note:  This document was prepared using Dragon voice recognition software and may include unintentional dictation errors.    Loleta RoseForbach, Caydence Koenig, MD 07/02/17 [phone]

## 2017-07-02 NOTE — ED Notes (Signed)
Pt has low abd pain.  Sx for 3 days.  Pt reports constipation.  Small BM today.  Pt took colace and a suppository without relief.  No back pain.  No urinary   No n/v.  Pt alert.

## 2017-07-02 NOTE — ED Triage Notes (Addendum)
Pt in with co lower abd pain x 3-4 days states has not had BM x 3-4 days. Denies any vomiting or dysuria. Has taken colace and fleets enema. Pt states he does have a hx of diverticulitis.

## 2017-07-02 NOTE — Discharge Instructions (Signed)
We believe your symptoms are caused by diverticulitis.  Most of the time this condition (please read through the included information) can be cured with outpatient antibiotics.  Please take the full course of prescribed medication(s) and follow up with the doctors recommended above.  Use Tylenol and/or ibuprofen as needed for pain.  Return to the ED if your abdominal pain worsens or fails to improve, you develop bloody vomiting, bloody diarrhea, you are unable to tolerate fluids due to vomiting, fever greater than 101, or other symptoms that concern you.

## 2018-02-22 ENCOUNTER — Other Ambulatory Visit: Payer: Self-pay

## 2018-02-22 ENCOUNTER — Encounter: Payer: Self-pay | Admitting: Emergency Medicine

## 2018-02-22 ENCOUNTER — Emergency Department
Admission: EM | Admit: 2018-02-22 | Discharge: 2018-02-22 | Disposition: A | Discharge status: Home / Self Care | Payer: 59 | Attending: Emergency Medicine | Admitting: Emergency Medicine

## 2018-02-22 DIAGNOSIS — Z7984 Long term (current) use of oral hypoglycemic drugs: Secondary | ICD-10-CM | POA: Insufficient documentation

## 2018-02-22 DIAGNOSIS — Q666 Other congenital valgus deformities of feet: Secondary | ICD-10-CM | POA: Diagnosis not present

## 2018-02-22 DIAGNOSIS — F17210 Nicotine dependence, cigarettes, uncomplicated: Secondary | ICD-10-CM | POA: Diagnosis not present

## 2018-02-22 DIAGNOSIS — M25562 Pain in left knee: Secondary | ICD-10-CM | POA: Insufficient documentation

## 2018-02-22 DIAGNOSIS — E119 Type 2 diabetes mellitus without complications: Secondary | ICD-10-CM | POA: Insufficient documentation

## 2018-02-22 DIAGNOSIS — Q749 Unspecified congenital malformation of limb(s): Secondary | ICD-10-CM

## 2018-02-22 DIAGNOSIS — G8929 Other chronic pain: Secondary | ICD-10-CM | POA: Diagnosis not present

## 2018-02-22 MED ORDER — DICLOFENAC SODIUM 50 MG PO TBEC: 50.0000 mg | DELAYED_RELEASE_TABLET | Freq: Two times a day (BID) | ORAL | 0 refills | Status: AC

## 2018-02-22 NOTE — ED Triage Notes (Signed)
Pt to ED via POV for knee pain x 3 months. Pt states that he has seen his PCP and was supposed to be seen by ortho but he has been unable to see them. Pt is in NAD at this time.

## 2018-02-22 NOTE — Discharge Instructions (Addendum)
Your exam is consistent with chronic knee pain and likely degenerative changes. You should follow-up with orthopedics as discussed. Start using the knee brace previously prescribed. Take the prescription anti-inflammatory as directed.
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
## 2018-12-29 ENCOUNTER — Other Ambulatory Visit: Payer: Self-pay | Admitting: Family Medicine

## 2018-12-29 DIAGNOSIS — M7989 Other specified soft tissue disorders: Secondary | ICD-10-CM

## 2019-01-27 IMAGING — CR DG CHEST 2V
1 series · 2 of 2 positions shown · non-contrast
Comparison: 05/28/2007

CLINICAL DATA: Fever since last night.

EXAM:
CHEST  2 VIEW

[Series 1: dg chest 2 view · 0.14mm/px · 2 of 2 slices shown]
[im 1/2]
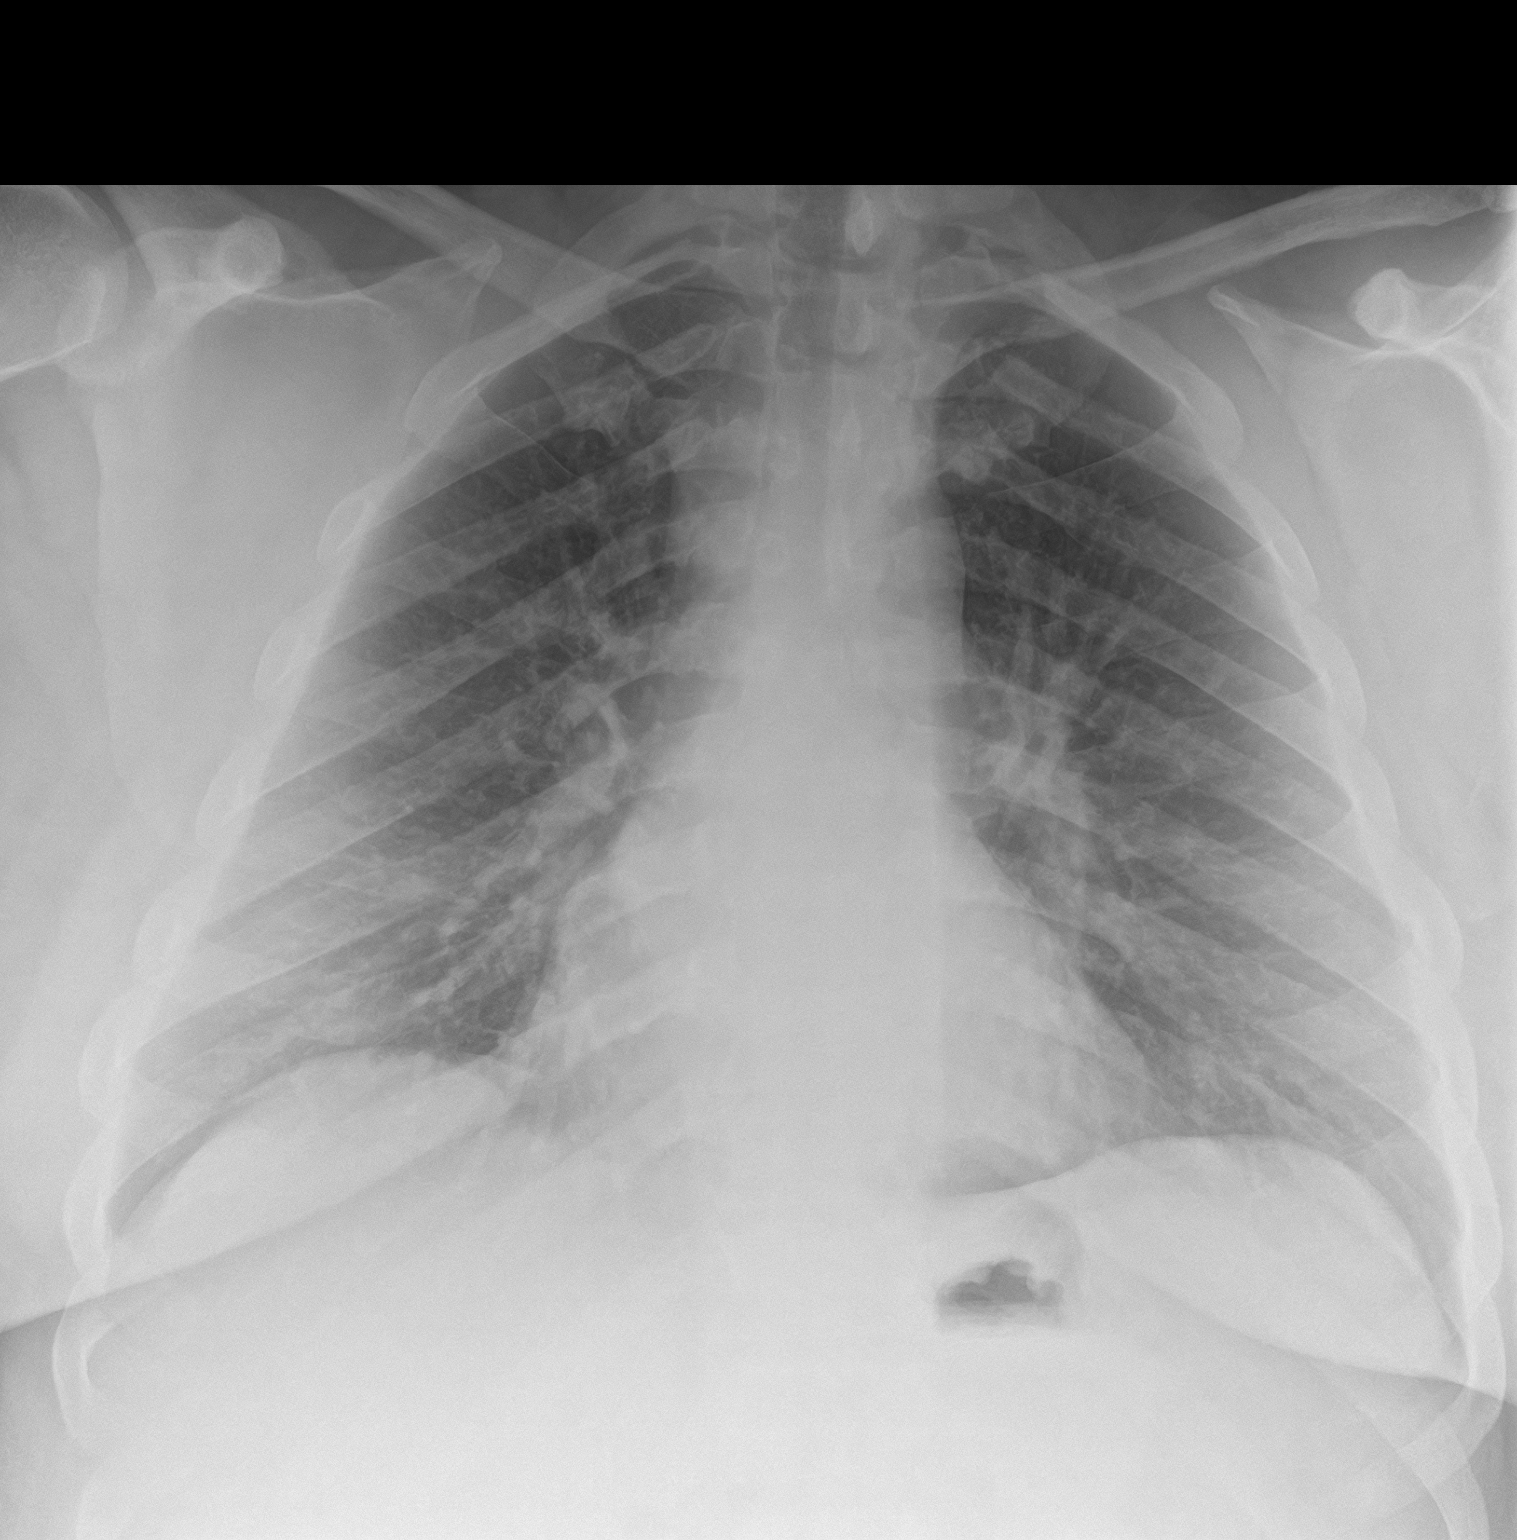
[im 2/2]
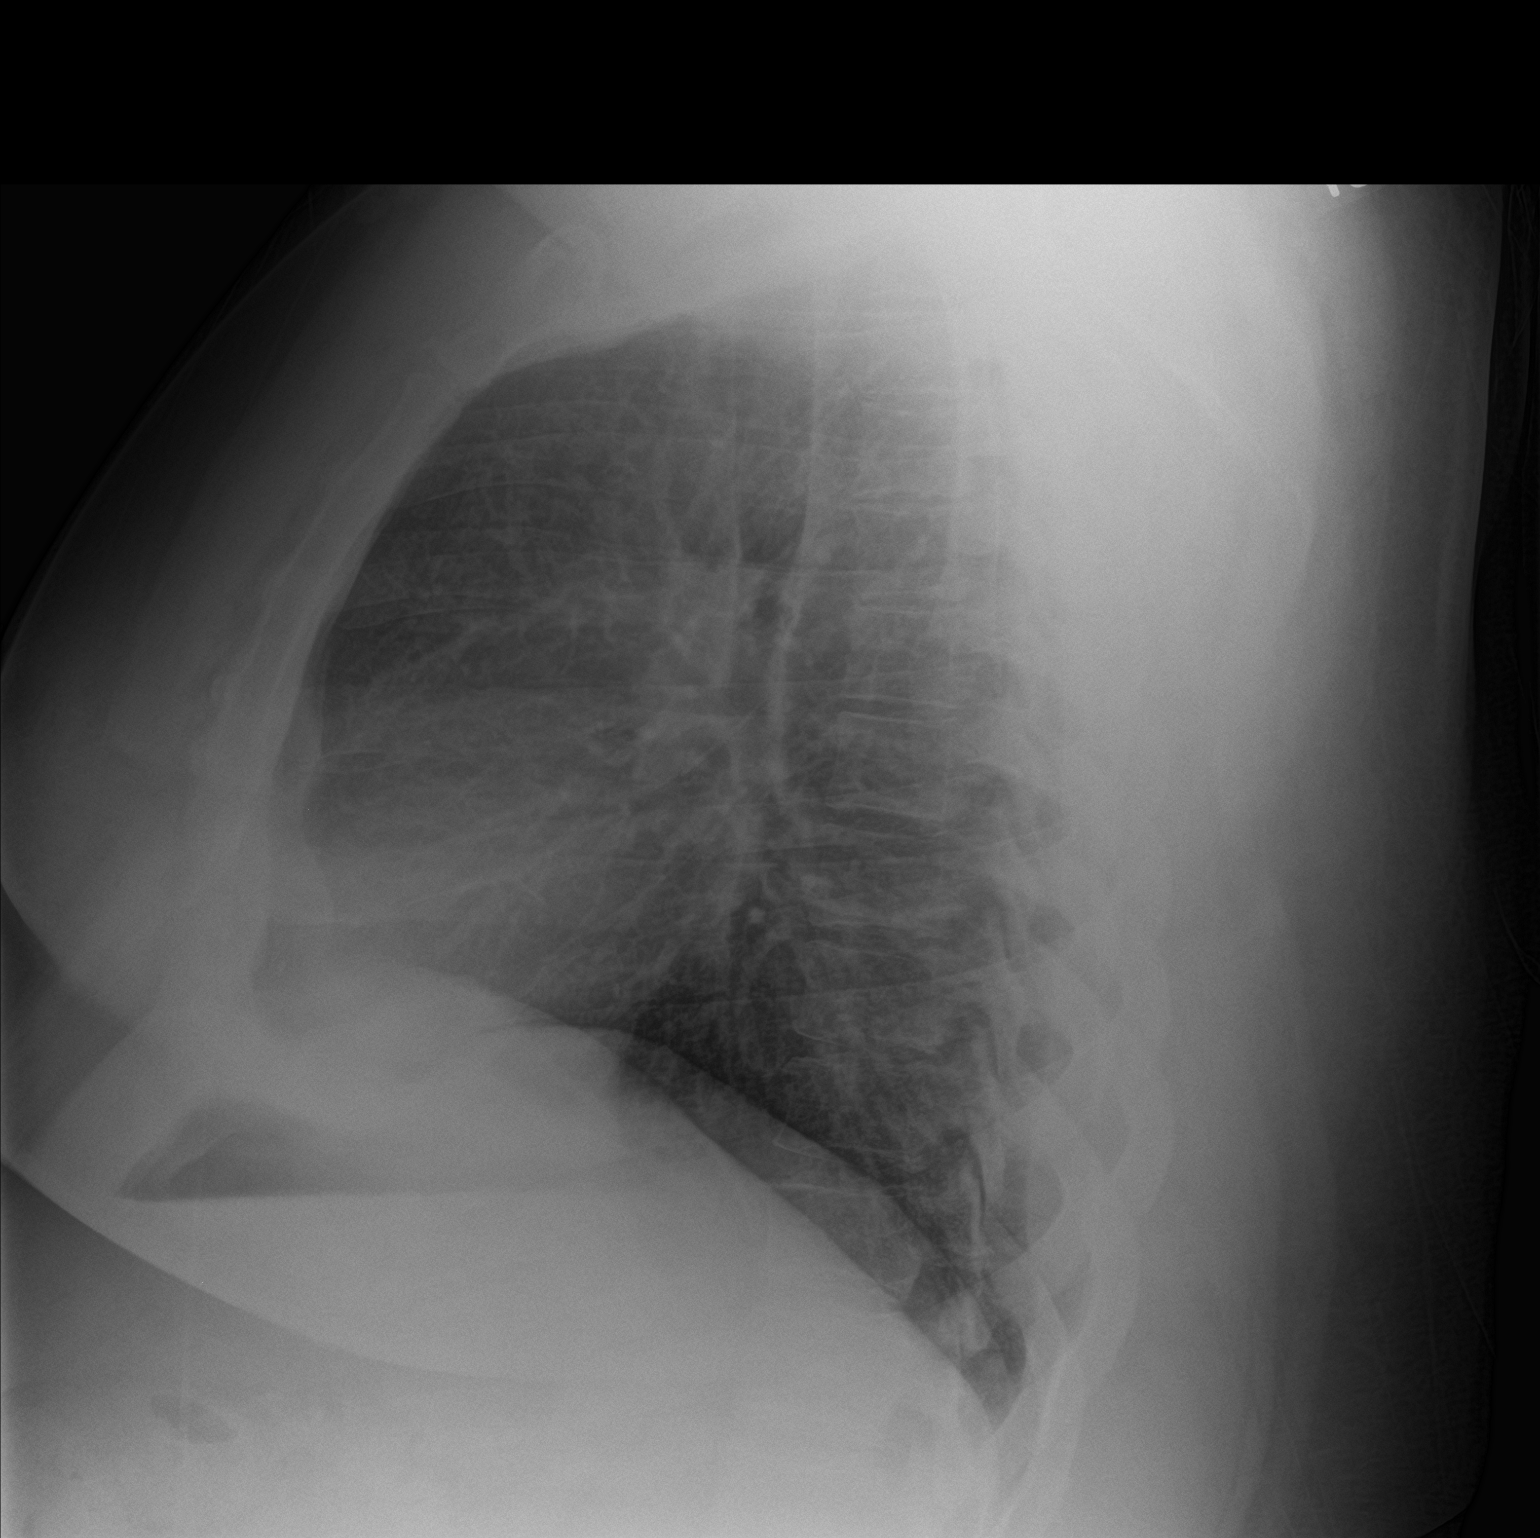

[2 of 2 positions shown; findings below may reference images not displayed]

FINDINGS: The heart size and mediastinal contours are within normal limits.
Both lungs are clear. The visualized skeletal structures are
unremarkable.
IMPRESSION: No active cardiopulmonary disease.

## 2020-05-29 ENCOUNTER — Ambulatory Visit: Payer: 59

## 2020-05-29 NOTE — Progress Notes (Unsigned)
   The problem of recurrent insomnia is discussed. Avoidance of caffeine sources is strongly encouraged. Sleep hygiene issues are reviewed. The use of sedative hypnotics for temporary relief is appropriate; we discussed the addictive nature of these drugs, and a one-time only prescription for prn use of a hypnotic is given, to use no more than 3 times per week for 2-3 weeks.  No PSG on file  04/25/20 - 05/24/20 CPAP @ 12 cmH2O, epr 3, fulltime Compliance @ 37% total average usage 2:48 Median average 3:49 hrs AHI @ 1.2 Leaks 29.5

## 2020-05-29 NOTE — Progress Notes (Unsigned)
[facility] [address] [place], [place] [address]  Pulmonary Sleep Medicine   Office Visit Note  Patient Name: Randy Harris DOB: [birth year] MRN [id number]    Chief Complaint: Obstructive Sleep Apnea visit  Brief History:  Randy Harris is seen today for initial consultation The patient has a *** history of sleep apnea. Patient *** using PAP nightly.  The patient feels *** after sleeping with PAP.  The patient reports *** from PAP use. Reported sleepiness is  *** and the Epworth Sleepiness Score is *** out of 24. The patient *** take naps. The patient complains of the following: ***  The compliance download shows  compliance with an average use time of *** hours. The AHI is ***  The patient *** of limb movements disrupting sleep.  ROS  General: (-) fever, (-) chills, (-) night sweat Nose and Sinuses: (-) nasal stuffiness or itchiness, (-) postnasal drip, (-) nosebleeds, (-) sinus trouble. Mouth and Throat: (-) sore throat, (-) hoarseness. Neck: (-) swollen glands, (-) enlarged thyroid, (-) neck pain. Respiratory: *** cough, *** shortness of breath, *** wheezing. Neurologic: *** numbness, *** tingling. Psychiatric: *** anxiety, *** depression   Current Medication: Outpatient Encounter Medications as of 05/29/2020  Medication Sig  . docusate sodium (COLACE) 100 MG capsule Take 1 tablet once or twice daily as needed for constipation  . glipiZIDE (GLUCOTROL XL) 10 MG 24 hr tablet Take 20 mg by mouth daily with breakfast.   . lisinopril (PRINIVIL,ZESTRIL) 5 MG tablet Take by mouth.  . metFORMIN (GLUCOPHAGE-XR) 500 MG 24 hr tablet Take 1,000 mg by mouth daily with breakfast.   . ondansetron (ZOFRAN ODT) 4 MG disintegrating tablet Allow 1-2 tablets to dissolve in your mouth every 8 hours as needed for nausea/vomiting  . pravastatin (PRAVACHOL) 40 MG tablet Take by mouth.   No facility-administered encounter medications on file as of 05/29/2020.    Surgical  History: Past Surgical History:  Procedure Laterality Date  . PENILE CYST REMOVAL      Medical History: Past Medical History:  Diagnosis Date  . Diabetes mellitus without complication (HCC)     Family History: Non contributory to the present illness  Social History: Social History   Socioeconomic History  . Marital status: Single    Spouse name: Not on file  . Number of children: Not on file  . Years of education: Not on file  . Highest education level: Not on file  Occupational History  . Not on file  Tobacco Use  . Smoking status: Current Every Day Smoker    Packs/day: 0.50    Types: Cigarettes  . Smokeless tobacco: Never Used  Substance and Sexual Activity  . Alcohol use: No  . Drug use: Not on file  . Sexual activity: Not on file  Other Topics Concern  . Not on file  Social History Narrative   Independent at baseline   Social Determinants of Health   Financial Resource Strain:   . Difficulty of Paying Living Expenses: Not on file  Food Insecurity:   . Worried About Programme researcher, broadcasting/film/video in the Last Year: Not on file  . Ran Out of Food in the Last Year: Not on file  Transportation Needs:   . Lack of Transportation (Medical): Not on file  . Lack of Transportation (Non-Medical): Not on file  Physical Activity:   . Days of Exercise per Week: Not on file  . Minutes of Exercise per Session: Not on file  Stress:   .  Feeling of Stress : Not on file  Social Connections:   . Frequency of Communication with Friends and Family: Not on file  . Frequency of Social Gatherings with Friends and Family: Not on file  . Attends Religious Services: Not on file  . Active Member of Clubs or Organizations: Not on file  . Attends Banker Meetings: Not on file  . Marital Status: Not on file  Intimate Partner Violence:   . Fear of Current or Ex-Partner: Not on file  . Emotionally Abused: Not on file  . Physically Abused: Not on file  . Sexually Abused: Not on  file    Vital Signs: There were no vitals taken for this visit.  Examination: General Appearance: The patient is well-developed, well-nourished, and in no distress. Neck Circumference: *** Skin: Gross inspection of skin unremarkable. Head: normocephalic, no gross deformities. Eyes: no gross deformities noted. ENT: ears appear grossly normal Neurologic: Alert and oriented. No involuntary movements.    EPWORTH SLEEPINESS SCALE:  Scale:  (0)= no chance of dozing; (1)= slight chance of dozing; (2)= moderate chance of dozing; (3)= high chance of dozing  Chance  Situtation    Sitting and reading: ***    Watching TV: ***    Sitting Inactive in public: ***    As a passenger in car: ***      Lying down to rest: ***    Sitting and talking: ***    Sitting quielty after lunch: ***    In a car, stopped in traffic: ***   TOTAL SCORE:   *** out of 24    SLEEP STUDIES:  1. ***   CPAP COMPLIANCE DATA:  04/25/20 - 05/24/20 CPAP @ 12 cmH2O, epr 3, fulltime Compliance @ 37% total average usage 2:48 Median average 3:49 hrs AHI @ 1.2 Leaks 29.5          LABS: No results found for this or any previous visit (from the past 2160 hour(s)).  Radiology: No results found.  No results found.  No results found.    Assessment and Plan: Patient Active Problem List   Diagnosis Date Noted  . Sepsis (HCC) 03/23/2017      The patient *** tolerate PAP and reports *** benefit from PAP use. The patient was reminded how to *** and advised to ***. The patient was also counselled on ***. The compliance is ***. The AHI is ***.   1. ***  General Counseling: I have discussed the findings of the evaluation and examination with Randy Harris.  I have also discussed any further diagnostic evaluation thatmay be needed or ordered today. Randy Harris verbalizes understanding of the findings of todays visit. We also reviewed his medications today and discussed drug interactions and side  effects including but not limited excessive drowsiness and altered mental states. We also discussed that there is always a risk not just to him but also people around him. he has been encouraged to call the office with any questions or concerns that should arise related to todays visit.  No orders of the defined types were placed in this encounter.       I have personally obtained a history, examined the patient, evaluated laboratory and imaging results, formulated the assessment and plan and placed orders.   Valentino Hue Sol Blazing, PhD, FAASM  Diplomate, American Board of Sleep Medicine    Yevonne Pax, MD [facility] Diplomate ABMS Pulmonary and Critical Care Medicine Sleep medicine

## 2020-09-22 ENCOUNTER — Ambulatory Visit: Payer: 59 | Admitting: *Deleted
